# Patient Record
Sex: Female | Born: 1935 | Race: White | Hispanic: No | Smoking: Never smoker
Health system: Southern US, Community
[De-identification: ages and names within clinical notes are randomized; demographics above are authoritative.]

## PROBLEM LIST (undated history)

## (undated) DIAGNOSIS — M109 Gout, unspecified: Secondary | ICD-10-CM

## (undated) DIAGNOSIS — M1990 Unspecified osteoarthritis, unspecified site: Secondary | ICD-10-CM

## (undated) DIAGNOSIS — I441 Atrioventricular block, second degree: Secondary | ICD-10-CM

## (undated) DIAGNOSIS — F410 Panic disorder [episodic paroxysmal anxiety] without agoraphobia: Secondary | ICD-10-CM

## (undated) DIAGNOSIS — I5189 Other ill-defined heart diseases: Secondary | ICD-10-CM

## (undated) DIAGNOSIS — I272 Pulmonary hypertension, unspecified: Secondary | ICD-10-CM

## (undated) DIAGNOSIS — F0390 Unspecified dementia without behavioral disturbance: Secondary | ICD-10-CM

## (undated) DIAGNOSIS — I10 Essential (primary) hypertension: Secondary | ICD-10-CM

## (undated) DIAGNOSIS — Z9981 Dependence on supplemental oxygen: Secondary | ICD-10-CM

## (undated) DIAGNOSIS — E119 Type 2 diabetes mellitus without complications: Secondary | ICD-10-CM

## (undated) DIAGNOSIS — E782 Mixed hyperlipidemia: Secondary | ICD-10-CM

## (undated) HISTORY — PX: ABDOMINAL HYSTERECTOMY: SHX81

## (undated) HISTORY — PX: OVARIAN CYST REMOVAL: SHX89

## (undated) HISTORY — DX: Essential (primary) hypertension: I10

## (undated) HISTORY — DX: Pulmonary hypertension, unspecified: I27.20

## (undated) HISTORY — DX: Atrioventricular block, second degree: I44.1

## (undated) HISTORY — DX: Other ill-defined heart diseases: I51.89

## (undated) HISTORY — PX: BREAST BIOPSY: SHX20

## (undated) HISTORY — DX: Panic disorder (episodic paroxysmal anxiety): F41.0

## (undated) HISTORY — DX: Gout, unspecified: M10.9

## (undated) HISTORY — PX: HEMORRHOID SURGERY: SHX153

## (undated) HISTORY — DX: Unspecified osteoarthritis, unspecified site: M19.90

## (undated) HISTORY — DX: Type 2 diabetes mellitus without complications: E11.9

## (undated) HISTORY — DX: Mixed hyperlipidemia: E78.2

---

## 2007-04-15 ENCOUNTER — Ambulatory Visit: Payer: Self-pay | Admitting: Cardiology

## 2007-04-18 ENCOUNTER — Inpatient Hospital Stay (HOSPITAL_COMMUNITY): Admission: AD | Admit: 2007-04-18 | Discharge: 2007-04-20 | Payer: Self-pay | Admitting: Cardiology

## 2007-04-18 ENCOUNTER — Ambulatory Visit: Payer: Self-pay | Admitting: Cardiology

## 2007-04-19 HISTORY — PX: PACEMAKER INSERTION: SHX728

## 2007-05-03 ENCOUNTER — Ambulatory Visit: Payer: Self-pay | Admitting: Physician Assistant

## 2007-05-19 ENCOUNTER — Ambulatory Visit: Payer: Self-pay | Admitting: Internal Medicine

## 2007-07-14 ENCOUNTER — Ambulatory Visit: Payer: Self-pay | Admitting: Internal Medicine

## 2008-02-23 ENCOUNTER — Ambulatory Visit: Payer: Self-pay | Admitting: Internal Medicine

## 2008-09-20 ENCOUNTER — Ambulatory Visit: Payer: Self-pay | Admitting: Internal Medicine

## 2008-12-25 ENCOUNTER — Ambulatory Visit: Payer: Self-pay | Admitting: Cardiology

## 2009-01-02 ENCOUNTER — Ambulatory Visit: Payer: Self-pay | Admitting: Cardiology

## 2009-01-02 ENCOUNTER — Ambulatory Visit: Payer: Self-pay | Admitting: Internal Medicine

## 2009-01-02 ENCOUNTER — Inpatient Hospital Stay (HOSPITAL_COMMUNITY): Admission: AD | Admit: 2009-01-02 | Discharge: 2009-01-06 | Payer: Self-pay | Admitting: Cardiovascular Disease

## 2009-01-02 ENCOUNTER — Encounter: Payer: Self-pay | Admitting: Internal Medicine

## 2009-01-03 ENCOUNTER — Encounter: Payer: Self-pay | Admitting: Internal Medicine

## 2009-01-04 ENCOUNTER — Encounter: Payer: Self-pay | Admitting: Cardiology

## 2009-01-23 ENCOUNTER — Ambulatory Visit: Payer: Self-pay | Admitting: Cardiology

## 2009-01-30 ENCOUNTER — Encounter: Payer: Self-pay | Admitting: Physician Assistant

## 2009-02-05 ENCOUNTER — Encounter: Payer: Self-pay | Admitting: Cardiology

## 2009-02-11 ENCOUNTER — Encounter: Payer: Self-pay | Admitting: Internal Medicine

## 2009-02-20 ENCOUNTER — Ambulatory Visit: Payer: Self-pay | Admitting: Internal Medicine

## 2009-04-09 ENCOUNTER — Ambulatory Visit: Payer: Self-pay | Admitting: Cardiology

## 2009-04-28 ENCOUNTER — Encounter: Admission: RE | Admit: 2009-04-28 | Discharge: 2009-07-27 | Payer: Self-pay | Admitting: Cardiology

## 2009-05-01 ENCOUNTER — Encounter: Payer: Self-pay | Admitting: Cardiology

## 2009-05-05 ENCOUNTER — Encounter: Payer: Self-pay | Admitting: Cardiology

## 2009-05-20 ENCOUNTER — Encounter: Payer: Self-pay | Admitting: Cardiology

## 2009-08-12 ENCOUNTER — Telehealth: Payer: Self-pay | Admitting: Cardiology

## 2009-08-22 ENCOUNTER — Ambulatory Visit: Payer: Self-pay | Admitting: Internal Medicine

## 2009-08-25 ENCOUNTER — Encounter: Payer: Self-pay | Admitting: Internal Medicine

## 2009-09-05 ENCOUNTER — Encounter: Payer: Self-pay | Admitting: Cardiology

## 2009-12-08 ENCOUNTER — Ambulatory Visit: Payer: Self-pay | Admitting: Cardiology

## 2009-12-08 DIAGNOSIS — J984 Other disorders of lung: Secondary | ICD-10-CM

## 2009-12-08 DIAGNOSIS — I5032 Chronic diastolic (congestive) heart failure: Secondary | ICD-10-CM

## 2009-12-08 DIAGNOSIS — I453 Trifascicular block: Secondary | ICD-10-CM

## 2009-12-08 DIAGNOSIS — H8110 Benign paroxysmal vertigo, unspecified ear: Secondary | ICD-10-CM | POA: Insufficient documentation

## 2009-12-08 DIAGNOSIS — I10 Essential (primary) hypertension: Secondary | ICD-10-CM

## 2009-12-08 DIAGNOSIS — Z950 Presence of cardiac pacemaker: Secondary | ICD-10-CM

## 2010-01-01 ENCOUNTER — Ambulatory Visit: Payer: Self-pay | Admitting: Internal Medicine

## 2010-01-08 ENCOUNTER — Encounter: Payer: Self-pay | Admitting: Cardiology

## 2010-01-14 ENCOUNTER — Encounter (INDEPENDENT_AMBULATORY_CARE_PROVIDER_SITE_OTHER): Payer: Self-pay | Admitting: *Deleted

## 2010-03-20 ENCOUNTER — Ambulatory Visit: Payer: Self-pay | Admitting: Internal Medicine

## 2010-06-22 ENCOUNTER — Ambulatory Visit: Payer: Self-pay | Admitting: Cardiology

## 2010-06-22 DIAGNOSIS — R0602 Shortness of breath: Secondary | ICD-10-CM | POA: Insufficient documentation

## 2010-06-30 ENCOUNTER — Encounter: Payer: Self-pay | Admitting: Internal Medicine

## 2010-06-30 ENCOUNTER — Ambulatory Visit: Payer: Self-pay | Admitting: Internal Medicine

## 2010-06-30 ENCOUNTER — Encounter: Payer: Self-pay | Admitting: Physician Assistant

## 2010-07-01 ENCOUNTER — Encounter (INDEPENDENT_AMBULATORY_CARE_PROVIDER_SITE_OTHER): Payer: Self-pay | Admitting: *Deleted

## 2010-07-23 ENCOUNTER — Ambulatory Visit: Payer: Self-pay | Admitting: Cardiology

## 2010-08-02 ENCOUNTER — Encounter: Payer: Self-pay | Admitting: Cardiology

## 2010-08-02 ENCOUNTER — Encounter: Payer: Self-pay | Admitting: Physician Assistant

## 2010-08-03 ENCOUNTER — Ambulatory Visit: Payer: Self-pay | Admitting: Cardiology

## 2010-08-04 ENCOUNTER — Ambulatory Visit: Payer: Self-pay | Admitting: Internal Medicine

## 2010-08-07 ENCOUNTER — Ambulatory Visit: Payer: Self-pay | Admitting: Internal Medicine

## 2010-08-12 ENCOUNTER — Encounter: Payer: Self-pay | Admitting: Cardiology

## 2010-08-19 ENCOUNTER — Ambulatory Visit: Payer: Self-pay | Admitting: Physician Assistant

## 2010-08-19 DIAGNOSIS — E119 Type 2 diabetes mellitus without complications: Secondary | ICD-10-CM

## 2010-08-26 ENCOUNTER — Encounter (INDEPENDENT_AMBULATORY_CARE_PROVIDER_SITE_OTHER): Payer: Self-pay | Admitting: *Deleted

## 2010-09-01 ENCOUNTER — Ambulatory Visit: Payer: Self-pay | Admitting: Cardiology

## 2010-09-01 ENCOUNTER — Encounter: Payer: Self-pay | Admitting: Physician Assistant

## 2010-09-18 ENCOUNTER — Ambulatory Visit: Payer: Self-pay | Admitting: Physician Assistant

## 2010-09-21 ENCOUNTER — Encounter (INDEPENDENT_AMBULATORY_CARE_PROVIDER_SITE_OTHER): Payer: Self-pay | Admitting: *Deleted

## 2010-10-06 ENCOUNTER — Encounter: Payer: Self-pay | Admitting: Physician Assistant

## 2010-10-28 ENCOUNTER — Encounter: Payer: Self-pay | Admitting: Physician Assistant

## 2010-11-04 ENCOUNTER — Ambulatory Visit: Payer: Self-pay | Admitting: Internal Medicine

## 2011-01-20 ENCOUNTER — Ambulatory Visit: Admission: RE | Admit: 2011-01-20 | Discharge: 2011-01-20 | Payer: Self-pay | Source: Home / Self Care

## 2011-01-20 ENCOUNTER — Encounter: Payer: Self-pay | Admitting: Internal Medicine

## 2011-01-28 NOTE — Cardiovascular Report (Signed)
Summary: [facility]  [facility]/ [facility] SUMMARY   Imported By: Dorise Hiss 08/11/2010 16:00:38  _____________________________________________________________________  External Attachment:    Type:   Image     Comment:   External Document

## 2011-01-28 NOTE — Cardiovascular Report (Signed)
Summary: TTM   TTM   Imported By: Roderic Ovens 01/21/2010 10:44:35  _____________________________________________________________________  External Attachment:    Type:   Image     Comment:   External Document

## 2011-01-28 NOTE — Cardiovascular Report (Signed)
Summary: TTM   TTM   Imported By: Roderic Ovens 07/16/2010 16:11:34  _____________________________________________________________________  External Attachment:    Type:   Image     Comment:   External Document

## 2011-01-28 NOTE — Letter (Signed)
Summary: Engineer, materials at [facility]  [address]   [place], [place] [address]   Phone: [phone]  Fax: [phone]        January 14, 2010 MRN: [id number]   [name] [name] [address] [facility] [address], [place]  [address]   Dear Ms. Sunday,  Your test ordered by Selena Batten has been reviewed by your physician (or physician assistant) and was found to be normal or stable. Your physician (or physician assistant) felt no changes were needed at this time.  ____ Echocardiogram  ____ Cardiac Stress Test  ____ Lab Work  ____ Peripheral vascular study of arms, legs or neck  ____ CT scan or X-ray  ____ Lung or Breathing test  __X__ Other:  blood pressure readings are within goal   Thank you.   Hoover Brunette, LPN    Duane Boston, M.D., F.A.C.C. Thressa Sheller, M.D., F.A.C.C. Oneal Grout, M.D., F.A.C.C. Cheree Ditto, M.D., F.A.C.C. Daiva Nakayama, M.D., F.A.C.C. Kenney Houseman, M.D., F.A.C.C. Jeanne Ivan, PA-C

## 2011-01-28 NOTE — Cardiovascular Report (Signed)
Summary: TTM   TTM   Imported By: Roderic Ovens 08/18/2010 11:16:20  _____________________________________________________________________  External Attachment:    Type:   Image     Comment:   External Document

## 2011-01-28 NOTE — Cardiovascular Report (Signed)
Summary: [facility]/ [facility]  [facility]/ [facility] SUMMARY   Imported By: Dorise Hiss 03/23/2010 10:53:34  _____________________________________________________________________  External Attachment:    Type:   Image     Comment:   External Document

## 2011-01-28 NOTE — Assessment & Plan Note (Signed)
Summary: 6 MO FU PER JUNE REMINDER-SRS   Visit Type:  Follow-up Primary Provider:  Margo Common   History of Present Illness: the patient is an 77 year old female history of poor exercise tolerance. The patient due to prior 6 minute walk Desaturation and is on oxygen therapy. She is also followed by Dr. Orson Aloe. She is felt to have chronic diastolic heart failure. Prior cardiac catheterization showed no significant coronary arteries and normal LV function. She also had a heart catheterization done earlier this year which showed normal right-sided pressures. She currently denies any chest pain and she does report feeling weak and very short of breath on minimal exertion. She uses oxygen intermittently. In the office her oxygen saturation was 94% upon a small walk was 91%. It was quite clear that the patient was very deconditioned. The patient reports that she has developed some lower extremity edema. She denies however any chest pain palpitations or syncope. the  Preventive Screening-Counseling & Management  Alcohol-Tobacco     Smoking Status: never  Current Medications (verified): 1)  Lisinopril 20 Mg Tabs (Lisinopril) .Marland Kitchen.. 1 Tablet By Mouth Once A Day 2)  Verapamil Hcl Cr 120 Mg Xr24h-Cap (Verapamil Hcl) .... Take One Capsule By Mouth Twice Daily 3)  Metformin Hcl 500 Mg Tabs (Metformin Hcl) .... Take 1 Tablet By Mouth Two Times A Day 4)  Pravastatin Sodium 40 Mg Tabs (Pravastatin Sodium) .... Take One Tablet By Mouth Daily At Bedtime 5)  Calcium + D 600-200 Mg-Unit Tabs (Calcium Carbonate-Vitamin D) .... Once Daily 6)  Aspirin 81 Mg Tbec (Aspirin) .... Take One Tablet By Mouth Daily 7)  Potassium Chloride Cr 8 Meq Cr-Tabs (Potassium Chloride) .... Take 2 Tablet By Mouth Two Times A Day 8)  Multivitamins   Tabs (Multiple Vitamin) .... Once Daily 9)  Naprosyn 500 Mg Tabs (Naproxen) .... As Needed 10)  Glipizide 5 Mg Tabs (Glipizide) .... Take 2 Tablets Daily As Needed 11)  Furosemide 20 Mg  Tabs (Furosemide) .... Take One Tablet By Mouth Daily As Needed 12)  Hydrochlorothiazide 25 Mg Tabs (Hydrochlorothiazide) .... Take One Tablet By Mouth Daily. 13)  Advair Diskus 500-50 Mcg/dose Aepb (Fluticasone-Salmeterol) .... One Inhaltion Two Times A Day 14)  Acetaminophen 500 Mg Caps (Acetaminophen) .... As Needed 15)  Fish Oil 1000 Mg Caps (Omega-3 Fatty Acids) .... Take 1 Tablet By Mouth Three Times A Day  Allergies (verified): No Known Drug Allergies  Comments:  Nurse/Medical Assistant: The patient's medication bottles and allergies were reviewed with the patient and were updated in the Medication and Allergy Lists.  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 Jude pacemakerdual-chamber pacemaker Diastolic dysfunction Normal Cardiolite study in 2008with normal LV function Hypertension Diabetes mellitus Dyslipidemia Metabolic syndrome Chronic dyspnea felt to be secondary to diastolic dysfunction Obesity History of osteoarthritis and gout  Past Surgical History: Last updated: 12/24/2008 remote history of ovarian cyst removal Breast biopsy Hemorrhoidectomy Hysterectomy  Family History: Last updated: 08/18/2009 Family History of Cancer:  Family History of CVA or Stroke:  Heart CHF  Social History: Last updated: 08/18/2009 Retired  Widowed  Tobacco Use - No.  Alcohol Use - no Regular Exercise - no Drug Use - no  Risk Factors: Exercise: no (08/18/2009)  Risk Factors: Smoking Status: never (06/22/2010)  Review of Systems       The patient complains of fatigue and shortness of breath.  The patient denies malaise, fever, weight gain/loss, vision loss,  decreased hearing, hoarseness, chest pain, palpitations, prolonged cough, wheezing, sleep apnea, coughing up blood, abdominal pain, blood in stool, nausea, vomiting, diarrhea, heartburn, incontinence, blood  in urine, muscle weakness, joint pain, leg swelling, rash, skin lesions, headache, fainting, dizziness, depression, anxiety, enlarged lymph nodes, easy bruising or bleeding, and environmental allergies.    Vital Signs:  Patient profile:   77 year old female Height:      60 inches Weight:      207 pounds O2 Sat:      94 % on Room air Pulse rate:   65 / minute BP sitting:   140 / 70  (left arm) Cuff size:   large  Vitals Entered By: Carlye Grippe (June 22, 2010 1:59 PM)  O2 Flow:  Room air  Physical Exam  Additional Exam:  General: Well-developed, well-nourished in no distress head: Normocephalic and atraumatic eyes PERRLA/EOMI intact, conjunctiva and lids normal nose: No deformity or lesions mouth normal dentition, normal posterior pharynx neck: Supple, no JVD.  No masses, thyromegaly or abnormal cervical nodes lungs: Normal breath sounds bilaterally without wheezing.  Normal percussion heart: regular rate and rhythm with normal S1 and S2, no S3 or S4.  PMI is normal.  No pathological murmurs abdomen: Normal bowel sounds, abdomen is soft and nontender without masses, organomegaly or hernias noted.  No hepatosplenomegaly musculoskeletal: Back normal, normal gait muscle strength and tone normal pulsus: Pulse is normal in all 4 extremities Extremities: 1+ peripheral pitting edema neurologic: Alert and oriented x 3 skin: Intact without lesions or rashes cervical nodes: No significant adenopathy psychologic: Normal affect    PPM Specifications Following MD:  Sherryl Manges, MD     PPM Vendor:  St Jude     PPM Model Number:  [id number]     PPM Serial Number:  [id number] PPM DOI:  04/19/2007     PPM Implanting MD:  Sherryl Manges, MD  Lead 1    Location: RA     DOI: 04/19/2007     Model #: [id number]     Serial #: [id number]     Status: active Lead 2    Location: RV     DOI: 04/19/2007     Model #: [id number]     Serial #: [id number]     Status: active  Magnet Response Rate:  BOL 98.6 ERI   86.3  Indications:  Syncope; tri-fasicular block   PPM Follow Up Pacer Dependent:  No      Episodes Coumadin:  No  Parameters Mode:  DDD     Lower Rate Limit:  50     Upper Rate Limit:  105 Paced AV Delay:  300     Sensed AV Delay:  275  Impression & Recommendations:  Problem # 1:  SHORTNESS OF BREATH (ICD-786.05) the patient has chronic shortness of breath. Is followed by Dr. Orson Aloe. Her oxygen saturation was stable. There is a small degree of volume overload with a large part patient is significantly deconditioned. Her updated medication list for this problem includes:    Lisinopril 20 Mg Tabs (Lisinopril) .Marland Kitchen... 1 tablet by mouth once a day    Verapamil Hcl Cr 120 Mg Xr24h-cap (Verapamil hcl) .Marland Kitchen... Take one capsule by mouth twice daily    Aspirin 81 Mg Tbec (Aspirin) .Marland Kitchen... Take one tablet by mouth daily    Furosemide 20 Mg Tabs (Furosemide) .Marland Kitchen... Take one tablet by mouth daily as needed    Hydrochlorothiazide 25 Mg Tabs (Hydrochlorothiazide) .Marland Kitchen... Take one tablet by mouth  daily.  Future Orders: T-Basic Metabolic Panel [id number]) ... 06/30/2010 T-BNP  (B Natriuretic Peptide) [id number]) ... 06/30/2010  Problem # 2:  CHRONIC DIASTOLIC HEART FAILURE (ICD-428.32) the patient is some increased lower showed edema. I asked her to take Lasix 20 mg p.o. q. daily x5 days only. We will check blood work in one week. Her updated medication list for this problem includes:    Lisinopril 20 Mg Tabs (Lisinopril) .Marland Kitchen... 1 tablet by mouth once a day    Verapamil Hcl Cr 120 Mg Xr24h-cap (Verapamil hcl) .Marland Kitchen... Take one capsule by mouth twice daily    Aspirin 81 Mg Tbec (Aspirin) .Marland Kitchen... Take one tablet by mouth daily    Furosemide 20 Mg Tabs (Furosemide) .Marland Kitchen... Take one tablet by mouth daily as needed    Hydrochlorothiazide 25 Mg Tabs (Hydrochlorothiazide) .Marland Kitchen... Take one tablet by mouth daily.  Problem # 3:  CARDIAC PACEMAKER IN SITU (ICD-V45.01) the patient's father Dr. Johney Frame. She apparently  has normal pacemaker function.  Problem # 4:  ESSENTIAL HYPERTENSION, BENIGN (ICD-401.1) blood pressure is well controlled. I made no further adjustments in her medications. Her updated medication list for this problem includes:    Lisinopril 20 Mg Tabs (Lisinopril) .Marland Kitchen... 1 tablet by mouth once a day    Verapamil Hcl Cr 120 Mg Xr24h-cap (Verapamil hcl) .Marland Kitchen... Take one capsule by mouth twice daily    Aspirin 81 Mg Tbec (Aspirin) .Marland Kitchen... Take one tablet by mouth daily    Furosemide 20 Mg Tabs (Furosemide) .Marland Kitchen... Take one tablet by mouth daily as needed    Hydrochlorothiazide 25 Mg Tabs (Hydrochlorothiazide) .Marland Kitchen... Take one tablet by mouth daily.  Future Orders: T-Basic Metabolic Panel [id number]) ... 06/30/2010 T-BNP  (B Natriuretic Peptide) [id number]) ... 06/30/2010  Patient Instructions: 1)  Lasix 20mg  daily x 5 days, then resume previous dose 2)  Labs:  BMET, BNP - due in 5 days 3)  Follow up in  1 month

## 2011-01-28 NOTE — Consult Note (Signed)
Summary: CARDIOLOGY CONSULT/ [facility]  CARDIOLOGY CONSULT/ [facility]   Imported By: Zachary George 08/05/2010 09:40:56  _____________________________________________________________________  External Attachment:    Type:   Image     Comment:   External Document

## 2011-01-28 NOTE — Medication Information (Signed)
Summary: RX Folder/ 4 Administrator, sports WITH Forensic scientist Folder/ 4 WHEEL WALKER WITH BRAKE   Imported By: Dorise Hiss 08/12/2010 16:26:30  _____________________________________________________________________  External Attachment:    Type:   Image     Comment:   External Document

## 2011-01-28 NOTE — Assessment & Plan Note (Signed)
Summary: PER PT CALL-IN [facility] NEEDS DEVICE CHECK-JM   Visit Type:  Pacemaker check Primary Provider:  Tapper   History of Present Illness: The patient presents today for routine electrophysiology followup.  She was recently hospitalized for SOB.  She is chronically on home oxygen but has not been using this when outside of the house.  She reports SOB and fatigue with moderate activity.  She also reports edema. The patient denies symptoms of palpitations, chest pain,  dizziness, presyncope, syncope, or neurologic sequela. The patient is tolerating medications without difficulties and is otherwise without complaint today.   Preventive Screening-Counseling & Management  Alcohol-Tobacco     Smoking Status: never  Current Medications (verified): 1)  Lisinopril 20 Mg Tabs (Lisinopril) .Marland Kitchen.. 1 Tablet By Mouth Once A Day 2)  Verapamil Hcl Cr 120 Mg Xr24h-Cap (Verapamil Hcl) .... Take One Capsule By Mouth Twice Daily 3)  Metformin Hcl 500 Mg Tabs (Metformin Hcl) .... Take 1 Tablet By Mouth Two Times A Day 4)  Pravastatin Sodium 40 Mg Tabs (Pravastatin Sodium) .... Take One Tablet By Mouth Daily At Bedtime 5)  Calcium + D 600-200 Mg-Unit Tabs (Calcium Carbonate-Vitamin D) .... Once Daily 6)  Aspirin 81 Mg Tbec (Aspirin) .... Take One Tablet By Mouth Daily 7)  Potassium Chloride Cr 8 Meq Cr-Tabs (Potassium Chloride) .... Take 2 Tablet By Mouth Two Times A Day 8)  Multivitamins   Tabs (Multiple Vitamin) .... Once Daily 9)  Naprosyn 500 Mg Tabs (Naproxen) .... As Needed 10)  Glipizide 5 Mg Tabs (Glipizide) .... Take 2 Tablets Daily As Needed 11)  Furosemide 20 Mg Tabs (Furosemide) .... Take One Tablet By Mouth Daily As Needed 12)  Hydrochlorothiazide 25 Mg Tabs (Hydrochlorothiazide) .... Take One Tablet By Mouth Daily. 13)  Advair Diskus 500-50 Mcg/dose Aepb (Fluticasone-Salmeterol) .... One Inhaltion Two Times A Day 14)  Acetaminophen 500 Mg Caps (Acetaminophen) .... As Needed 15)  Fish Oil 1000 Mg  Caps (Omega-3 Fatty Acids) .... Take 1 Tablet By Mouth Three Times A Day  Allergies (verified): No Known Drug Allergies  Comments:  Nurse/Medical Assistant: The patient is currently on medications but does not know the name or dosage at this time. Instructed to contact our office with details. Will update medication list at that time.  Past History:  Past Medical History: Reviewed history from 12/24/2008 and no changes required. 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 Jude pacemakerdual-chamber pacemaker Diastolic dysfunction Normal Cardiolite study in 2008with normal LV function Hypertension Diabetes mellitus Dyslipidemia Metabolic syndrome Chronic dyspnea felt to be secondary to diastolic dysfunction Obesity History of osteoarthritis and gout  Past Surgical History: Reviewed history from 12/24/2008 and no changes required. remote history of ovarian cyst removal Breast biopsy Hemorrhoidectomy Hysterectomy  Social History: Reviewed history from 08/18/2009 and no changes required. Retired  Widowed  Tobacco Use - No.  Alcohol Use - no Regular Exercise - no Drug Use - no  Review of Systems       All systems are reviewed and negative except as listed in the HPI.   Vital Signs:  Patient profile:   77 year old female Height:      60 inches Weight:      211 pounds Pulse rate:   58 / minute BP sitting:   141 / 76  (left arm) Cuff size:   large  Vitals Entered By: Carlye Grippe (August 07, 2010 9:03 AM)  Physical Exam  General:  obese, NAD Head:  normocephalic  and atraumatic Eyes:  PERRLA/EOM intact; conjunctiva and lids normal. Mouth:  Teeth, gums and palate normal. Oral mucosa normal. Neck:  JVP 10cm Chest Wall:  pacemaker site well healed Lungs:  few basilar rales, mostly clear, normal WOB Heart:  RRR, no m/r/g Abdomen:  Bowel sounds positive; abdomen soft and non-tender without masses, organomegaly, or hernias  noted. No hepatosplenomegaly. Msk:  Back normal, normal gait. Muscle strength and tone normal. Pulses:  pulses normal in all 4 extremities Extremities:  1+ BLE Edema Neurologic:  Alert and oriented x 3.   PPM Specifications Following MD:  Sherryl Manges, MD     PPM Vendor:  St Jude     PPM Model Number:  [id number]     PPM Serial Number:  [id number] PPM DOI:  04/19/2007     PPM Implanting MD:  Sherryl Manges, MD  Lead 1    Location: [facility]     DOI: 04/19/2007     Model #: [id number]     Serial #: [id number]     Status: active Lead 2    Location: RV     DOI: 04/19/2007     Model #: [id number]     Serial #: [id number]     Status: active  Magnet Response Rate:  BOL 98.6 ERI  86.3  Indications:  Syncope; tri-fasicular block   PPM Follow Up Battery Voltage:  2.78 V     Battery Est. Longevity:  7.75-10 yrs     Pacer Dependent:  No       PPM Device Measurements Atrium  Amplitude: 1.7 mV, Impedance: 329 ohms, Threshold: 0.750 V at 0.4 msec Right Ventricle  Amplitude: 4.7 mV, Impedance: 333 ohms, Threshold: 0.625 V at 0.4 msec  Episodes MS Episodes:  0     Coumadin:  No Ventricular High Rate:  0     Atrial Pacing:  2.5%     Ventricular Pacing:  33%  Parameters Mode:  DDD     Lower Rate Limit:  50     Upper Rate Limit:  105 Paced AV Delay:  300     Sensed AV Delay:  275 Next Cardiology Appt Due:  01/27/2011 Tech Comments:  PT WAS IN HOSP LAST FRIDAY FOR CHEST TIGHTNESS AND SOB.  NORMAL DEVICE FUNCTION.  CHANGED RA OUTPUT TO 1.75 AND RV OUTPUT TO 0.875 V.  TURNED RATE RESPONSE ON DURING MODE SWITCH.  [employer] PHONE CHECK IN 3 MTHS AND ROV IN 6 MTHS W/CLINIC.  Vella Kohler  August 07, 2010 9:23 AM MD Comments:  agree  Impression & Recommendations:  Problem # 1:  CHRONIC DIASTOLIC HEART FAILURE (ICD-428.32) SHe appears to have mild acute on chronic CHF. I have recommended salt restriction and daily weights. She will take lasix 20mg  daily for the next 3 days, then return to HCTZ alone. SHe may need daily lasix  longterm.  SHe is encouraged to wear O2 when ambulatory.  Problem # 2:  SHORTNESS OF BREATH (ICD-786.05) as above no wheezing today  Problem # 3:  CARDIAC PACEMAKER IN SITU (ICD-V45.01) normal pacemaker function for trifascicular block  Problem # 4:  ESSENTIAL HYPERTENSION, BENIGN (ICD-401.1)  stable  Her updated medication list for this problem includes:    Lisinopril 20 Mg Tabs (Lisinopril) .Marland Kitchen... 1 tablet by mouth once a day    Verapamil Hcl Cr 120 Mg Xr24h-cap (Verapamil hcl) .Marland Kitchen... Take one capsule by mouth twice daily    Aspirin 81 Mg Tbec (Aspirin) .Marland Kitchen... Take one tablet by mouth daily  Furosemide 20 Mg Tabs (Furosemide) .Marland Kitchen... Take one tablet by mouth daily as needed    Hydrochlorothiazide 25 Mg Tabs (Hydrochlorothiazide) .Marland Kitchen... Take one tablet by mouth daily.  Patient Instructions: 1)  Take Lasix (furosemide) 20mg  once daily for 3 days.  2)  Salt Restriction 3)  Keep appt with Rozell Searing, PA on August 18, 2010 at 2:20pm.

## 2011-01-28 NOTE — Letter (Signed)
Summary: Engineer, materials at [facility]  [address]   [place], [place] [address]   Phone: [phone]  Fax: [phone]        August 26, 2010 MRN: [id number]   EVANNE MATSUNAGA [address] [facility] [address], [place]  [address]   Dear Ms. Pautsch,  Your test ordered by Selena Batten has been reviewed by your physician (or physician assistant) and was found to be normal or stable. Your physician (or physician assistant) felt no changes were needed at this time.  ____ Echocardiogram  ____ Cardiac Stress Test  __X__ Lab Work  ____ Peripheral vascular study of arms, legs or neck  ____ CT scan or X-ray  ____ Lung or Breathing test  ____ Other:   Thank you.   Hoover Brunette, LPN    Duane Boston, M.D., F.A.C.C. Thressa Sheller, M.D., F.A.C.C. Oneal Grout, M.D., F.A.C.C. Cheree Ditto, M.D., F.A.C.C. Daiva Nakayama, M.D., F.A.C.C. Kenney Houseman, M.D., F.A.C.C. Jeanne Ivan, PA-C

## 2011-01-28 NOTE — Assessment & Plan Note (Signed)
Summary: 1 MO F/U PER 8/24 OV-JM   Visit Type:  Follow-up Primary Provider:  Margo Common   History of Present Illness: patient presents for scheduled early followup.  When last seen, I referred her for followup labs, after being placed on Lasix 20 mg daily, per Dr. Johney Frame, for treatment of probable diastolic heart failure. Renal function was normal, with potassium 4.0.  I also ordered a followup 2-D echo, for reassessment of LVF. This indicated normal LVF, with evidence of diastolic dysfunction, mild MR, and moderate pulmonary hypertension.  She continues to complain of exertional dyspnea. She denies any associated chest pain, or tachycardia palpitations. She has noticed marked decrease in her peripheral edema.  Preventive Screening-Counseling & Management  Alcohol-Tobacco     Smoking Status: never  Current Medications (verified): 1)  Lisinopril 20 Mg Tabs (Lisinopril) .Marland Kitchen.. 1 Tablet By Mouth Once A Day 2)  Verapamil Hcl Cr 120 Mg Xr24h-Cap (Verapamil Hcl) .... Take One Capsule By Mouth Twice Daily 3)  Metformin Hcl 500 Mg Tabs (Metformin Hcl) .... Take 1 Tablet By Mouth Two Times A Day 4)  Pravastatin Sodium 40 Mg Tabs (Pravastatin Sodium) .... Take One Tablet By Mouth Daily At Bedtime 5)  Calcium + D 600-200 Mg-Unit Tabs (Calcium Carbonate-Vitamin D) .... Once Daily 6)  Aspirin 81 Mg Tbec (Aspirin) .... Take One Tablet By Mouth Daily 7)  Potassium Chloride Cr 8 Meq Cr-Tabs (Potassium Chloride) .... Take 2 Tablet By Mouth Two Times A Day 8)  Multivitamins   Tabs (Multiple Vitamin) .... Once Daily 9)  Naprosyn 500 Mg Tabs (Naproxen) .... As Needed 10)  Glipizide 5 Mg Tabs (Glipizide) .... Take 2 Tablets Daily As Needed 11)  Furosemide 20 Mg Tabs (Furosemide) .... Take One Tablet By Mouth Daily As Needed 12)  Hydrochlorothiazide 25 Mg Tabs (Hydrochlorothiazide) .... Take One Tablet By Mouth Daily. 13)  Advair Diskus 500-50 Mcg/dose Aepb (Fluticasone-Salmeterol) .... One Inhaltion Two Times  A Day(Not Taking) 14)  Acetaminophen 500 Mg Caps (Acetaminophen) .... As Needed 15)  Fish Oil 1000 Mg Caps (Omega-3 Fatty Acids) .... Take 1 Tablet By Mouth Three Times A Day  Allergies (verified): No Known Drug Allergies  Comments:  Nurse/Medical Assistant: The patient's medication list and allergies were reviewed with the patient and were updated in the Medication and Allergy Lists.  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 Jude pacemakerdual-chamber pacemaker Diastolic dysfunction Normal Cardiolite study in 2008with normal LV function Hypertension Diabetes mellitus Dyslipidemia Metabolic syndrome Chronic dyspnea felt to be secondary to diastolic dysfunction Obesity History of osteoarthritis Gout Mitral regurgitation... mild by 2-D echo, 9/11 Pulmonary hypertension, moderate  Review of Systems       No fevers, chills, hemoptysis, dysphagia, melena, hematocheezia, hematuria, rash, claudication, orthopnea, or pnd. complaint of urinary incontinence. All other systems negative.    Vital Signs:  Patient profile:   77 year old female Height:      60 inches Weight:      203 pounds BMI:     39.79 Pulse rate:   71 / minute BP sitting:   112 / 74  (left arm) Cuff size:   large  Vitals Entered By: Carlye Grippe (September 18, 2010 1:37 PM)  Nutrition Counseling: Patient's BMI is greater than 25 and therefore counseled on weight management options.  Physical Exam  Additional Exam:  GEN: 77 year old female, morbidly obese, no distress HEENT: NCAT,PERRLA,EOMI NECK: palpable pulses, no bruits; no JVD; no TM LUNGS:  CTA bilaterally HEART: RRR (S1S2); no significant murmurs; no rubs; no gallops ABD: soft, NT; intact BS EXT: intact distal pulses; no edema SKIN: warm, dry MUSC: no obvious deformity NEURO: A/O (x3)     PPM Specifications Following MD:  Sherryl Manges, MD     PPM Vendor:  [employer]  [employer]     PPM Model Number:  [phone]     PPM Serial Number:  [id number] DOI:  04/19/2007     PPM Implanting MD:  Sherryl Manges, MD  Lead 1    Location: RA     DOI: 04/19/2007     Model #: [id number]     Serial #: [id number]     Status: active Lead 2    Location: RV     DOI: 04/19/2007     Model #: [id number]     Serial #: [id number]     Status: active  Magnet Response Rate:  BOL 98.6 ERI  86.3  Indications:  Syncope; tri-fasicular block   PPM Follow Up Pacer Dependent:  No      Episodes Coumadin:  No  Parameters Mode:  DDD     Lower Rate Limit:  50     Upper Rate Limit:  105 Paced AV Delay:  300     Sensed AV Delay:  275  Impression & Recommendations:  Problem # 1:  CHRONIC DIASTOLIC HEART FAILURE (ICD-428.32)  patient has lost 1 pound since her last visit, and 8 pounds since being placed on maintenance Lasix 20 mg daily. Will reassess electrolytes/renal function with surveillance labs today. We'll also check baseline BNP level. Will also schedule an appointment with Dr. Beatris Ship, for evaluation of possible bladder prolapse. Given her frequent trips to the bathroom, I elected to not up titrate her Lasix dose, at present time, and also pending a formal urology evaluation. We'll reassess her diuretic regimen, at time of her next visit with [place].  Problem # 2:  CARDIAC PACEMAKER IN SITU (ICD-V45.01)  follow up Dr. Hillis Range, as scheduled.  Problem # 3:  DM (ICD-250.00) Assessment: Comment Only  Her updated medication list for this problem includes:    Lisinopril 20 Mg Tabs (Lisinopril) .Marland Kitchen... 1 tablet by mouth once a day    Metformin Hcl 500 Mg Tabs (Metformin hcl) .Marland Kitchen... Take 1 tablet by mouth two times a day    Aspirin 81 Mg Tbec (Aspirin) .Marland Kitchen... Take one tablet by mouth daily    Glipizide 5 Mg Tabs (Glipizide) .Marland Kitchen... Take 2 tablets daily as needed  Problem # 4:  ESSENTIAL HYPERTENSION, BENIGN (ICD-401.1)  well controlled on current medication regimen.  Her updated medication list for this  problem includes:    Lisinopril 20 Mg Tabs (Lisinopril) .Marland Kitchen... 1 tablet by mouth once a day    Verapamil Hcl Cr 120 Mg Xr24h-cap (Verapamil hcl) .Marland Kitchen... Take one capsule by mouth twice daily    Aspirin 81 Mg Tbec (Aspirin) .Marland Kitchen... Take one tablet by mouth daily    Furosemide 20 Mg Tabs (Furosemide) .Marland Kitchen... Take one tablet by mouth daily as needed    Hydrochlorothiazide 25 Mg Tabs (Hydrochlorothiazide) .Marland Kitchen... Take one tablet by mouth daily.  Other Orders: T-Basic Metabolic Panel [phone]) T-BNP  (B Natriuretic Peptide) [phone]) Urology Referral (Urology)  Patient Instructions: 1)  You have been referred to Dr. Beatris Ship, Urologist. 2)  Your physician recommends that you go to the [facility] for lab work. 3)  Your physician wants you to follow-up in: 3 months. You will receive  a reminder letter in the mail one-two months in advance. If you don't receive a letter, please call our office to schedule the follow-up appointment.

## 2011-01-28 NOTE — Letter (Signed)
Summary: External Correspondence/ [facility]  External Correspondence/ [facility]   Imported By: Dorise Hiss 10/06/2010 15:17:31  _____________________________________________________________________  External Attachment:    Type:   Image     Comment:   External Document

## 2011-01-28 NOTE — Progress Notes (Signed)
Summary: Office Visit/ BLOOD PRESSURE READINGS  Office Visit/ BLOOD PRESSURE READINGS   Imported By: Dorise Hiss 01/08/2010 14:47:31  _____________________________________________________________________  External Attachment:    Type:   Image     Comment:   External Document  Appended Document: Office Visit/ BLOOD PRESSURE READINGS BP readings are within goal  Appended Document: Office Visit/ BLOOD PRESSURE READINGS Patient notified.

## 2011-01-28 NOTE — Letter (Signed)
Summary: Engineer, materials at [facility]  [address]   [place], [place] [address]   Phone: [phone]  Fax: [phone]        July 01, 2010 MRN: [id number]   JANSEN GOODPASTURE [address] [facility] [address], [place]  [address]   Dear Ms. Lockridge,  Your test ordered by Selena Batten has been reviewed by your physician (or physician assistant) and was found to be normal or stable. Your physician (or physician assistant) felt no changes were needed at this time.  ____ Echocardiogram  ____ Cardiac Stress Test  __X__ Lab Work  ____ Peripheral vascular study of arms, legs or neck  ____ CT scan or X-ray  ____ Lung or Breathing test  ____ Other:   Thank you.   Hoover Brunette, LPN    Duane Boston, M.D., F.A.C.C. Thressa Sheller, M.D., F.A.C.C. Oneal Grout, M.D., F.A.C.C. Cheree Ditto, M.D., F.A.C.C. Daiva Nakayama, M.D., F.A.C.C. Kenney Houseman, M.D., F.A.C.C. Jeanne Ivan, PA-C

## 2011-01-28 NOTE — Assessment & Plan Note (Signed)
Summary: eph -d/c [facility]   Visit Type:  Follow-up Primary [name] [name]:  [name] [name]   History of Present Illness: patient presents for post [facility] followup, following admission with dyspnea.   Patient was ultimately diagnosed with anxiety disorder. There was no evidence of ischemia or congestive heart failure (BNP level 26). Given history of VT during dobutamine stress testing in 2010, however, (subsequent normal cardiac catheterization), arrangements were made for pacemaker interrogation here in our device clinic, per Dr. Johney Frame. This yielded normal device function, and adjustments were made in RA and RV output, with rate response turned on during mode switch.  Of note, Dr. Johney Frame also recommended taking Lasix 20 mg on a daily, rather than p.r.n., basis, for treatment of mild acute/chronic diastolic heart failure. This has resulted in a loss of 7 pounds. However, patient continues to report DOE, with no associated chest pain. She has noted marked improvement in her peripheral edema.  Preventive Screening-Counseling & Management  Alcohol-Tobacco     Smoking Status: never  Current Medications (verified): 1)  Lisinopril 20 Mg Tabs (Lisinopril) .Marland Kitchen.. 1 Tablet By Mouth Once A Day 2)  Verapamil Hcl Cr 120 Mg Xr24h-Cap (Verapamil Hcl) .... Take One Capsule By Mouth Twice Daily 3)  Metformin Hcl 500 Mg Tabs (Metformin Hcl) .... Take 1 Tablet By Mouth Two Times A Day 4)  Pravastatin Sodium 40 Mg Tabs (Pravastatin Sodium) .... Take One Tablet By Mouth Daily At Bedtime 5)  Calcium + D 600-200 Mg-Unit Tabs (Calcium Carbonate-Vitamin D) .... Once Daily 6)  Aspirin 81 Mg Tbec (Aspirin) .... Take One Tablet By Mouth Daily 7)  Potassium Chloride Cr 8 Meq Cr-Tabs (Potassium Chloride) .... Take 2 Tablet By Mouth Two Times A Day 8)  Multivitamins   Tabs (Multiple Vitamin) .... Once Daily 9)  Naprosyn 500 Mg Tabs (Naproxen) .... As Needed 10)  Glipizide 5 Mg Tabs (Glipizide) .... Take 2 Tablets Daily As  Needed 11)  Furosemide 20 Mg Tabs (Furosemide) .... Take One Tablet By Mouth Daily As Needed 12)  Hydrochlorothiazide 25 Mg Tabs (Hydrochlorothiazide) .... Take One Tablet By Mouth Daily. 13)  Advair Diskus 500-50 Mcg/dose Aepb (Fluticasone-Salmeterol) .... One Inhaltion Two Times A Day 14)  Acetaminophen 500 Mg Caps (Acetaminophen) .... As Needed 15)  Fish Oil 1000 Mg Caps (Omega-3 Fatty Acids) .... Take 1 Tablet By Mouth Three Times A Day  Allergies (verified): No Known Drug Allergies  Comments:  Nurse/Medical Assistant: The patient's medication list and allergies were reviewed with the patient and were updated in the Medication and Allergy Lists.  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 Jude pacemakerdual-chamber pacemaker Diastolic dysfunction Normal Cardiolite study in 2008with normal LV function Hypertension Diabetes mellitus Dyslipidemia Metabolic syndrome Chronic dyspnea felt to be secondary to diastolic dysfunction Obesity History of osteoarthritis and gout  Review of Systems       No fevers, chills, hemoptysis, dysphagia, melena, hematocheezia, hematuria, rash, claudication, orthopnea, pnd, pedal edema. All other systems negative.   Vital Signs:  Patient profile:   77 year old female Height:      60 inches Weight:      204 pounds Pulse rate:   67 / minute BP sitting:   117 / 70  (left arm) Cuff size:   large  Vitals Entered By: [name] [name] (August 19, 2010 2:50 PM)  Physical Exam  Additional Exam:  GEN: 77 year old female, morbidly obese, no distress HEENT: NCAT,PERRLA,EOMI NECK:  palpable pulses, no bruits; no JVD; no TM LUNGS: CTA bilaterally HEART: RRR (S1S2); no significant murmurs; no rubs; no gallops ABD: soft, NT; intact BS EXT: intact distal pulses; no edema SKIN: warm, dry MUSC: no obvious deformity NEURO: A/O (x3)     PPM  Specifications Following MD:  Sherryl Manges, MD     PPM Vendor:  [employer] Jude     PPM Model Number:  [id number]     PPM Serial Number:  [id number] DOI:  04/19/2007     PPM Implanting MD:  Sherryl Manges, MD  Lead 1    Location: RA     DOI: 04/19/2007     Model #: [id number]     Serial #: [id number]     Status: active Lead 2    Location: RV     DOI: 04/19/2007     Model #: 1788TC     Serial #: [id number]     Status: active  Magnet Response Rate:  BOL 98.6 ERI  86.3  Indications:  Syncope; tri-fasicular block   PPM Follow Up Pacer Dependent:  No      Episodes Coumadin:  No  Parameters Mode:  DDD     Lower Rate Limit:  50     Upper Rate Limit:  105 Paced AV Delay:  300     Sensed AV Delay:  275  Impression & Recommendations:  Problem # 1:  CHRONIC DIASTOLIC HEART FAILURE (ICD-428.32)  continue current treatment regimen with Lasix 20 mg daily. Check followup metabolic profile, for monitoring of electrolytes and renal function. Patient advised to not add any salt to her diet. We'll reassess LV function with a 2-D echo, and schedule return visit with me and Dr. Andee Lineman in one month.  Problem # 2:  CARDIAC PACEMAKER IN SITU (ICD-V45.01)  patient has history of syncope/trifascicular block, with recent interrogation yielding normal device function. Adjustments were made, as noted above. Followup with Dr. Johney Frame, as previously scheduled.  Problem # 3:  ESSENTIAL HYPERTENSION, BENIGN (ICD-401.1) Assessment: Comment Only  Her updated medication list for this problem includes:    Lisinopril 20 Mg Tabs (Lisinopril) .Marland Kitchen... 1 tablet by mouth once a day    Verapamil Hcl Cr 120 Mg Xr24h-cap (Verapamil hcl) .Marland Kitchen... Take one capsule by mouth twice daily    Aspirin 81 Mg Tbec (Aspirin) .Marland Kitchen... Take one tablet by mouth daily    Furosemide 20 Mg Tabs (Furosemide) .Marland Kitchen... Take one tablet by mouth daily as needed    Hydrochlorothiazide 25 Mg Tabs (Hydrochlorothiazide) .Marland Kitchen... Take one tablet by mouth daily.  Problem # 4:  DM  (ICD-250.00) Assessment: Comment Only  normal cardiac catheterization, 1/10, following documented MMVT during dobutamine stress testing.  Other Orders: 2-D Echocardiogram (2D Echo) T-Basic Metabolic Panel ([id number])  Patient Instructions: 1)  Your physician recommends that you go to the [facility] for lab work: DO TODAY. 2)  Your physician has requested that you have an echocardiogram.  Echocardiography is a painless test that uses sound waves to create images of your heart. It provides your doctor with information about the size and shape of your heart and how well your heart's chambers and valves are working.  This procedure takes approximately one hour. There are no restrictions for this procedure. 3)  Continue Lasix 20mg  by mouth once daily. 4)  No added-salt diet. 5)  Follow up with Gene Serpe, PA-C/Dr. Earnestine Leys on Friday, Sept 23, 2011 at 1:40pm.

## 2011-01-28 NOTE — Letter (Signed)
Summary: Engineer, materials at [facility]  [address]   [place], [place] [address]   Phone: [phone]  Fax: [phone]        September 21, 2010 MRN: [id number]   [name] [name] [address] [facility] [address] [place]  [address]   Dear [name],  Your test ordered by Selena Batten has been reviewed by your physician (or physician assistant) and was found to be normal or stable. Your physician (or physician assistant) felt no changes were needed at this time.  ____ Echocardiogram  ____ Cardiac Stress Test  __X__ Lab Work  ____ Peripheral vascular study of arms, legs or neck  ____ CT scan or X-ray  ____ Lung or Breathing test  ____ Other:   Thank you.   Hoover Brunette, LPN    Duane Boston, M.D., F.A.C.C. Thressa Sheller, M.D., F.A.C.C. Oneal Grout, M.D., F.A.C.C. Cheree Ditto, M.D., F.A.C.C. Daiva Nakayama, M.D., F.A.C.C. Kenney Houseman, M.D., F.A.C.C. Jeanne Ivan, PA-C

## 2011-01-28 NOTE — Assessment & Plan Note (Signed)
Summary: 1 MO FU   Visit Type:  Follow-up Primary Provider:  Margo Common   History of Present Illness: [name] [name] is a 77 year old female with poor exercise tolerance. However she has no significant coronary artery disease. Her catheterization was essentially within normal limits, showed normal LV function and normal right heart pressures. [name] [name]'s shortness of breath is due to deconditioning. He also during laboratory work and again it was confirmed that she has no heart failure with a BNP level is quite low.  [name] [name] states that she has difficulty walking with her cane. She fatigues easily.she denies any chest pain orthopnea PND.[name] [name] also has stress incontinence which limits her ability to come out of [name] house. Asked her to see a urologist for this problem  Preventive Screening-Counseling & Management  Alcohol-Tobacco     Smoking Status: never  Current Medications (verified): 1)  Lisinopril 20 Mg Tabs (Lisinopril) .Marland Kitchen.. 1 Tablet By Mouth Once A Day 2)  Verapamil Hcl Cr 120 Mg Xr24h-Cap (Verapamil Hcl) .... Take One Capsule By Mouth Twice Daily 3)  Metformin Hcl 500 Mg Tabs (Metformin Hcl) .... Take 1 Tablet By Mouth Two Times A Day 4)  Pravastatin Sodium 40 Mg Tabs (Pravastatin Sodium) .... Take One Tablet By Mouth Daily At Bedtime 5)  Calcium + D 600-200 Mg-Unit Tabs (Calcium Carbonate-Vitamin D) .... Once Daily 6)  Aspirin 81 Mg Tbec (Aspirin) .... Take One Tablet By Mouth Daily 7)  Potassium Chloride Cr 8 Meq Cr-Tabs (Potassium Chloride) .... Take 2 Tablet By Mouth Two Times A Day 8)  Multivitamins   Tabs (Multiple Vitamin) .... Once Daily 9)  Naprosyn 500 Mg Tabs (Naproxen) .... As Needed 10)  Glipizide 5 Mg Tabs (Glipizide) .... Take 2 Tablets Daily As Needed 11)  Furosemide 20 Mg Tabs (Furosemide) .... Take One Tablet By Mouth Daily As Needed 12)  Hydrochlorothiazide 25 Mg Tabs (Hydrochlorothiazide) .... Take One Tablet By Mouth Daily. 13)  Advair Diskus 500-50  Mcg/dose Aepb (Fluticasone-Salmeterol) .... One Inhaltion Two Times A Day 14)  Acetaminophen 500 Mg Caps (Acetaminophen) .... As Needed 15)  Fish Oil 1000 Mg Caps (Omega-3 Fatty Acids) .... Take 1 Tablet By Mouth Three Times A Day  Allergies (verified): No Known Drug Allergies  Comments:  Nurse/Medical Assistant: [name] [name]'s medication list and allergies were reviewed with [name] [name] and were updated in [name] Medication and Allergy Lists.  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 Jude pacemakerdual-chamber pacemaker Diastolic dysfunction Normal Cardiolite study in 2008with normal LV function Hypertension Diabetes mellitus Dyslipidemia Metabolic syndrome Chronic dyspnea felt to be secondary to diastolic dysfunction Obesity History of osteoarthritis and gout  Past Surgical History: Last updated: 12/24/2008 remote history of ovarian cyst removal Breast biopsy Hemorrhoidectomy Hysterectomy  Family History: Last updated: 08/18/2009 Family History of Cancer:  Family History of CVA or Stroke:  Heart CHF  Social History: Last updated: 08/18/2009 Retired  Widowed  Tobacco Use - No.  Alcohol Use - no Regular Exercise - no Drug Use - no  Risk Factors: Smoking Status: never (07/23/2010)  Vital Signs:  [name] profile:   77 year old female Height:      60 inches Weight:      206 pounds O2 Sat:      93 % on Room air Pulse rate:   71 / minute BP sitting:   121 / 69  (left arm) Cuff size:   large  Vitals  Entered By: Carlye Grippe (July 23, 2010 1:23 PM)  O2 Flow:  Room air  Physical Exam  Additional Exam:  General: Well-developed, well-nourished in no distress head: Normocephalic and atraumatic eyes PERRLA/EOMI intact, conjunctiva and lids normal nose: No deformity or lesions mouth normal dentition, normal posterior pharynx neck: Supple, no JVD.  No  masses, thyromegaly or abnormal cervical nodes lungs: Normal breath sounds bilaterally without wheezing.  Normal percussion heart: regular rate and rhythm with normal S1 and S2, no S3 or S4.  PMI is normal.  No pathological murmurs abdomen: Normal bowel sounds, abdomen is soft and nontender without masses, organomegaly or hernias noted.  No hepatosplenomegaly musculoskeletal: Back normal, normal gait muscle strength and tone normal pulsus: Pulse is normal in all 4 extremities Extremities: 1+ peripheral pitting edema neurologic: Alert and oriented x 3 skin: Intact without lesions or rashes cervical nodes: No significant adenopathy psychologic: Normal affect    PPM Specifications Following MD:  Sherryl Manges, MD     PPM Vendor:  St Jude     PPM Model Number:  [id number]     PPM Serial Number:  [id number] DOI:  04/19/2007     PPM Implanting MD:  Sherryl Manges, MD  Lead 1    Location: RA     DOI: 04/19/2007     Model #: [id number]     Serial #: [id number]     Status: active Lead 2    Location: RV     DOI: 04/19/2007     Model #: 1788TC     Serial #: [id number]     Status: active  Magnet Response Rate:  BOL 98.6 ERI  86.3  Indications:  Syncope; tri-fasicular block   PPM Follow Up Pacer Dependent:  No      Episodes Coumadin:  No  Parameters Mode:  DDD     Lower Rate Limit:  50     Upper Rate Limit:  105 Paced AV Delay:  300     Sensed AV Delay:  275  Impression & Recommendations:  Problem # 1:  SHORTNESS OF BREATH (ICD-786.05) [name] [name] is severely deconditioned. I recommended weight reduction. I prescribed a walker for her so she can start exercising. [name] [name] also could benefit from formal cardiac rehabilitation. Her son states that he will pay for this. No further cardiac workup his wife right now Her updated medication list for this problem includes:    Lisinopril 20 Mg Tabs (Lisinopril) .Marland Kitchen... 1 tablet by mouth once a day    Verapamil Hcl Cr 120 Mg Xr24h-cap (Verapamil hcl) .Marland Kitchen... Take  one capsule by mouth twice daily    Aspirin 81 Mg Tbec (Aspirin) .Marland Kitchen... Take one tablet by mouth daily    Furosemide 20 Mg Tabs (Furosemide) .Marland Kitchen... Take one tablet by mouth daily as needed    Hydrochlorothiazide 25 Mg Tabs (Hydrochlorothiazide) .Marland Kitchen... Take one tablet by mouth daily.  Problem # 2:  CHRONIC DIASTOLIC HEART FAILURE (ICD-428.32) Assessment: Comment Only stable Her updated medication list for this problem includes:    Lisinopril 20 Mg Tabs (Lisinopril) .Marland Kitchen... 1 tablet by mouth once a day    Verapamil Hcl Cr 120 Mg Xr24h-cap (Verapamil hcl) .Marland Kitchen... Take one capsule by mouth twice daily    Aspirin 81 Mg Tbec (Aspirin) .Marland Kitchen... Take one tablet by mouth daily    Furosemide 20 Mg Tabs (Furosemide) .Marland Kitchen... Take one tablet by mouth daily as needed    Hydrochlorothiazide 25 Mg Tabs (Hydrochlorothiazide) .Marland Kitchen... Take one tablet by mouth  daily.  Problem # 3:  CARDIAC PACEMAKER IN SITU (ICD-V45.01) normal pacemaker function  Problem # 4:  ESSENTIAL HYPERTENSION, BENIGN (ICD-401.1) blood pressure is well controlled. Her updated medication list for this problem includes:    Lisinopril 20 Mg Tabs (Lisinopril) .Marland Kitchen... 1 tablet by mouth once a day    Verapamil Hcl Cr 120 Mg Xr24h-cap (Verapamil hcl) .Marland Kitchen... Take one capsule by mouth twice daily    Aspirin 81 Mg Tbec (Aspirin) .Marland Kitchen... Take one tablet by mouth daily    Furosemide 20 Mg Tabs (Furosemide) .Marland Kitchen... Take one tablet by mouth daily as needed    Hydrochlorothiazide 25 Mg Tabs (Hydrochlorothiazide) .Marland Kitchen... Take one tablet by mouth daily.  [name] Instructions: 1)  Your physician wants you to follow-up in: 6 months. You will receive a reminder letter in [name] mail one-two months in advance. If you don't receive a letter, please call our office to schedule [name] follow-up appointment. 2)  Your physician recommends that you continue on your current medications as directed. Please refer to [name] Current Medication list given to you today. Prescriptions: Lyda Perone with a seat and brakes  #1 x 0   Entered by:   Cyril Loosen, RN, BSN   Authorized by:   Lewayne Bunting, MD, [facility]   Signed by:   Cyril Loosen, RN, BSN on 07/23/2010   Method used:   Print then Give to [name]   RxID:   [id number]

## 2011-01-28 NOTE — Assessment & Plan Note (Signed)
Summary: pc2   Visit Type:  Pacemaker check Primary Provider:  Tapper  CC:  pacer check.  History of Present Illness: The patient presents today for routine electrophysiology followup. She reports doing very well since last being seen in our clinic.   She continues to have chronic dypnea which is not worse.  The patient denies symptoms of palpitations, chest pain,  lower extremity edema, dizziness, presyncope, syncope, or neurologic sequela. The patient is tolerating medications without difficulties and is otherwise without complaint today.   Preventive Screening-Counseling & Management  Alcohol-Tobacco     Smoking Status: never  Current Medications (verified): 1)  Lisinopril 20 Mg Tabs (Lisinopril) .Marland Kitchen.. 1 Tablet By Mouth Once A Day 2)  Verapamil Hcl Cr 120 Mg Xr24h-Cap (Verapamil Hcl) .... Take One Capsule By Mouth Twice Daily 3)  Metformin Hcl 500 Mg Tabs (Metformin Hcl) .... Two Times A Day 4)  Pravastatin Sodium 40 Mg Tabs (Pravastatin Sodium) .... Take One Tablet By Mouth Daily At Bedtime 5)  Calcium + D 600-200 Mg-Unit Tabs (Calcium Carbonate-Vitamin D) .... Once Daily 6)  Aspirin 81 Mg Tbec (Aspirin) .... Take One Tablet By Mouth Daily 7)  Potassium Chloride Crys Cr 20 Meq Cr-Tabs (Potassium Chloride Crys Cr) .... Take Two Tablet By Mouth Two Times A Day 8)  Multivitamins   Tabs (Multiple Vitamin) .... Once Daily 9)  Ibuprofen 200 Mg Tabs (Ibuprofen) .... As Needed 10)  Naprosyn 500 Mg Tabs (Naproxen) .... As Needed 11)  Glipizide 5 Mg Tabs (Glipizide) .... Take 2 Tablets Daily As Needed 12)  Furosemide 20 Mg Tabs (Furosemide) .... Take One Tablet By Mouth Daily As Needed 13)  Hydrochlorothiazide 25 Mg Tabs (Hydrochlorothiazide) .... Take One Tablet By Mouth Daily. 14)  Advair Diskus 500-50 Mcg/dose Aepb (Fluticasone-Salmeterol) .... One Inhaltion Two Times A Day  Allergies (verified): No Known Drug Allergies  Comments:  Nurse/Medical Assistant: The patient's medications  and allergies were reviewed with the patient and were updated in the Medication and Allergy Lists. List reviewed that didn't have any doses.  Past History:  Past Medical History: Reviewed history from 12/24/2008 and no changes required. 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 Jude pacemakerdual-chamber pacemaker Diastolic dysfunction Normal Cardiolite study in 2008with normal LV function Hypertension Diabetes mellitus Dyslipidemia Metabolic syndrome Chronic dyspnea felt to be secondary to diastolic dysfunction Obesity History of osteoarthritis and gout  Past Surgical History: Reviewed history from 12/24/2008 and no changes required. remote history of ovarian cyst removal Breast biopsy Hemorrhoidectomy Hysterectomy  Vital Signs:  Patient profile:   77 year old female Height:      60 inches Weight:      198 pounds Pulse rate:   84 / minute BP sitting:   122 / 68  (left arm) Cuff size:   large  Vitals Entered By: Carlye Grippe (March 20, 2010 2:08 PM) CC: pacer check   Physical Exam  General:  morbidy obese, NAD Head:  normocephalic and atraumatic Eyes:  PERRLA/EOM intact; conjunctiva and lids normal. Mouth:  Teeth, gums and palate normal. Oral mucosa normal. Neck:  Neck supple, no JVD. No masses, thyromegaly or abnormal cervical nodes. Lungs:  clear Heart:  RRR, no m/r/g Abdomen:  Bowel sounds positive; abdomen soft and non-tender without masses, organomegaly, or hernias noted. No hepatosplenomegaly. Msk:  walks with a cane Pulses:  pulses normal in all 4 extremities Extremities:  no edema Neurologic:  Alert and oriented x 3. Skin:  Intact without lesions or rashes.  PPM Specifications Following MD:  Sherryl Manges, MD     PPM Vendor:  St Jude     PPM Model Number:  [id number]     PPM Serial Number:  [id number] PPM DOI:  04/19/2007     PPM Implanting MD:  Sherryl Manges, MD  Lead 1    Location: RA     DOI: 04/19/2007     Model  #: 1699TC     Serial #: [id number]     Status: active Lead 2    Location: RV     DOI: 04/19/2007     Model #: 1788TC     Serial #: [id number]     Status: active  Magnet Response Rate:  BOL 98.6 ERI  86.3  Indications:  Syncope; tri-fasicular block   PPM Follow Up Remote Check?  No Battery Voltage:  2.78 V     Battery Est. Longevity:  10 years     Pacer Dependent:  No       PPM Device Measurements Atrium  Amplitude: 1.8 mV, Impedance: 351 ohms, Threshold: 0.75 V at 0.4 msec Right Ventricle  Amplitude: 7.3 mV, Impedance: 33 ohms, Threshold: 0.875 V at 0.4 msec  Episodes MS Episodes:  0     Percent Mode Switch:  0     Coumadin:  No Atrial Pacing:  12     Ventricular Pacing:  35  Parameters Mode:  DDD     Lower Rate Limit:  50     Upper Rate Limit:  105 Paced AV Delay:  300     Sensed AV Delay:  275 Next Cardiology Appt Due:  08/27/2010 Tech Comments:  Autocapture reprogrammed.  TTM's with [employer].  ROV 6 months in [place] clinic. Altha Harm, LPN  March 20, 2010 2:50 PM  MD Comments:  agree,  no afib or Ventricular arrhythmias  Impression & Recommendations:  Problem # 1:  CHRONIC DIASTOLIC HEART FAILURE (ICD-428.32) stable no changes today weight loss would help her SOB significantly  Problem # 2:  TRIFASCICULAR BLOCK (ICD-426.54) normal pacemaker function no changes today  Problem # 3:  ESSENTIAL HYPERTENSION, BENIGN (ICD-401.1) stable no changes  Problem # 4:  CARDIAC PACEMAKER IN SITU (ICD-V45.01) normal function no further VT no atrial arrhythmias

## 2011-01-28 NOTE — Letter (Signed)
Summary: External Correspondence/ PROGRESS NOTE DR. GARY CARL  External Correspondence/ PROGRESS NOTE DR. GARY CARL   Imported By: Dorise Hiss 10/30/2010 08:38:08  _____________________________________________________________________  External Attachment:    Type:   Image     Comment:   External Document

## 2011-02-03 NOTE — Assessment & Plan Note (Signed)
Summary: pacer clinic   Current Medications (verified): 1)  Lisinopril 20 Mg Tabs (Lisinopril) .Marland Kitchen.. 1 Tablet By Mouth Once A Day 2)  Verapamil Hcl Cr 120 Mg Xr24h-Cap (Verapamil Hcl) .... Take One Capsule By Mouth Twice Daily 3)  Metformin Hcl 500 Mg Tabs (Metformin Hcl) .... Take 1 Tablet By Mouth Two Times A Day 4)  Pravastatin Sodium 40 Mg Tabs (Pravastatin Sodium) .... Take One Tablet By Mouth Daily At Bedtime 5)  Calcium + D 600-200 Mg-Unit Tabs (Calcium Carbonate-Vitamin D) .... Once Daily 6)  Aspirin 81 Mg Tbec (Aspirin) .... Take One Tablet By Mouth Daily 7)  Potassium Chloride Cr 8 Meq Cr-Tabs (Potassium Chloride) .... Take 2 Tablet By Mouth Two Times A Day 8)  Multivitamins   Tabs (Multiple Vitamin) .... Once Daily 9)  Naprosyn 500 Mg Tabs (Naproxen) .... As Needed 10)  Glipizide 5 Mg Tabs (Glipizide) .... Take 2 Tablets Daily As Needed 11)  Furosemide 20 Mg Tabs (Furosemide) .... Take One Tablet By Mouth Daily As Needed 12)  Hydrochlorothiazide 25 Mg Tabs (Hydrochlorothiazide) .... Take One Tablet By Mouth Daily. 13)  Acetaminophen 500 Mg Caps (Acetaminophen) .... As Needed 14)  Fish Oil 1000 Mg Caps (Omega-3 Fatty Acids) .... Take 1 Tablet By Mouth Three Times A Day  Allergies (verified): No Known Drug Allergies   PPM Specifications Following MD:  Sherryl Manges, MD     PPM Vendor:  St Jude     PPM Model Number:  [id number]     PPM Serial Number:  [id number] DOI:  04/19/2007     PPM Implanting MD:  Sherryl Manges, MD  Lead 1    Location: RA     DOI: 04/19/2007     Model #: 1699TC     Serial #: [id number]     Status: active Lead 2    Location: RV     DOI: 04/19/2007     Model #: 1788TC     Serial #: [id number]     Status: active  Magnet Response Rate:  BOL 98.6 ERI  86.3  Indications:  Syncope; tri-fasicular block   PPM Follow Up Battery Voltage:  2.78 V     Pacer Dependent:  No       PPM Device Measurements Atrium  Amplitude: 1.8 mV, Impedance: 326 ohms, Threshold: 0.750 V at  0.4 msec Right Ventricle  Amplitude: 8.0 mV, Impedance: 337 ohms, Threshold: 0.750 V at 0.4 msec  Episodes MS Episodes:  0     Percent Mode Switch:  0     Coumadin:  No Ventricular High Rate:  0     Atrial Pacing:  1.6%     Ventricular Pacing:  26%  Parameters Mode:  DDD     Lower Rate Limit:  50     Upper Rate Limit:  105 Paced AV Delay:  300     Sensed AV Delay:  275 Next Cardiology Appt Due:  06/28/2011 Tech Comments:  NORMAL DEVICE FUNCTION.  NO EPISODES OR MODE SWITCHES.  CHANGED RA OUTPUT FROM 1.625 TO 1.75 V.  ROV IN 6 MTHS W/JA. Vella Kohler  January 20, 2011 11:42 AM

## 2011-02-03 NOTE — Cardiovascular Report (Signed)
Summary: Office Visit   Office Visit   Imported By: Roderic Ovens 01/25/2011 16:04:23  _____________________________________________________________________  External Attachment:    Type:   Image     Comment:   External Document

## 2011-04-09 ENCOUNTER — Other Ambulatory Visit: Payer: Self-pay | Admitting: Cardiology

## 2011-04-12 LAB — CULTURE, BLOOD (ROUTINE X 2): Culture: NO GROWTH

## 2011-04-12 LAB — BASIC METABOLIC PANEL
BUN: 15 mg/dL (ref 6–23)
BUN: 16 mg/dL (ref 6–23)
BUN: 21 mg/dL (ref 6–23)
CO2: 25 mEq/L (ref 19–32)
CO2: 27 mEq/L (ref 19–32)
Calcium: 8.3 mg/dL — ABNORMAL LOW (ref 8.4–10.5)
Calcium: 8.7 mg/dL (ref 8.4–10.5)
Calcium: 8.7 mg/dL (ref 8.4–10.5)
Calcium: 9 mg/dL (ref 8.4–10.5)
Chloride: 106 mEq/L (ref 96–112)
Chloride: 98 mEq/L (ref 96–112)
Creatinine, Ser: 1.01 mg/dL (ref 0.4–1.2)
GFR calc Af Amer: 35 mL/min — ABNORMAL LOW (ref >60)
GFR calc non Af Amer: 29 mL/min — ABNORMAL LOW (ref >60)
GFR calc non Af Amer: 43 mL/min — ABNORMAL LOW (ref >60)
GFR calc non Af Amer: 53 mL/min — ABNORMAL LOW (ref >60)
GFR calc non Af Amer: >60 mL/min (ref >60)
Glucose, Bld: 152 mg/dL — ABNORMAL HIGH (ref 70–99)
Glucose, Bld: 179 mg/dL — ABNORMAL HIGH (ref 70–99)
Glucose, Bld: 179 mg/dL — ABNORMAL HIGH (ref 70–99)
Glucose, Bld: 186 mg/dL — ABNORMAL HIGH (ref 70–99)
Glucose, Bld: 209 mg/dL — ABNORMAL HIGH (ref 70–99)
Potassium: 2.9 mEq/L — ABNORMAL LOW (ref 3.5–5.1)
Potassium: 3.7 mEq/L (ref 3.5–5.1)
Potassium: 4 mEq/L (ref 3.5–5.1)
Sodium: 134 mEq/L — ABNORMAL LOW (ref 135–145)
Sodium: 134 mEq/L — ABNORMAL LOW (ref 135–145)
Sodium: 135 mEq/L (ref 135–145)
Sodium: 140 mEq/L (ref 135–145)

## 2011-04-12 LAB — CARDIAC PANEL(CRET KIN+CKTOT+MB+TROPI)
CK, MB: 3.8 ng/mL (ref 0.3–4.0)
Relative Index: 3.2 — ABNORMAL HIGH (ref 0.0–2.5)
Total CK: 92 U/L (ref 7–177)

## 2011-04-12 LAB — GLUCOSE, CAPILLARY
Glucose-Capillary: 118 mg/dL — ABNORMAL HIGH (ref 70–99)
Glucose-Capillary: 150 mg/dL — ABNORMAL HIGH (ref 70–99)
Glucose-Capillary: 161 mg/dL — ABNORMAL HIGH (ref 70–99)
Glucose-Capillary: 239 mg/dL — ABNORMAL HIGH (ref 70–99)

## 2011-04-12 LAB — POCT I-STAT 3, VENOUS BLOOD GAS (G3P V)
Acid-base deficit: 1 mmol/L (ref 0.0–2.0)
Acid-base deficit: 2 mmol/L (ref 0.0–2.0)
Bicarbonate: 24.6 mEq/L — ABNORMAL HIGH (ref 20.0–24.0)
O2 Saturation: 53 %
O2 Saturation: 64 %
pCO2, Ven: 44.9 mmHg — ABNORMAL LOW (ref 45.0–50.0)
pCO2, Ven: 47.3 mmHg (ref 45.0–50.0)
pH, Ven: 7.324 — ABNORMAL HIGH (ref 7.250–7.300)
pH, Ven: 7.394 — ABNORMAL HIGH (ref 7.250–7.300)
pO2, Ven: 31 mmHg (ref 30.0–45.0)

## 2011-04-12 LAB — CBC
HCT: 36.9 % (ref 36.0–46.0)
HCT: 37.3 % (ref 36.0–46.0)
Hemoglobin: 12.2 g/dL (ref 12.0–15.0)
Hemoglobin: 12.5 g/dL (ref 12.0–15.0)
Hemoglobin: 14.2 g/dL (ref 12.0–15.0)
MCHC: 33.1 g/dL (ref 30.0–36.0)
MCHC: 33.1 g/dL (ref 30.0–36.0)
MCHC: 33.5 g/dL (ref 30.0–36.0)
MCV: 89.6 fL (ref 78.0–100.0)
MCV: 90.6 fL (ref 78.0–100.0)
Platelets: 180 10*3/uL (ref 150–400)
Platelets: 193 10*3/uL (ref 150–400)
RDW: 13.7 % (ref 11.5–15.5)
RDW: 14 % (ref 11.5–15.5)
RDW: 14.1 % (ref 11.5–15.5)
RDW: 14.3 % (ref 11.5–15.5)

## 2011-04-12 LAB — POCT I-STAT 3, ART BLOOD GAS (G3+)
Acid-Base Excess: 2 mmol/L (ref 0.0–2.0)
Bicarbonate: 26.5 mEq/L — ABNORMAL HIGH (ref 20.0–24.0)
O2 Saturation: 91 %
pO2, Arterial: 61 mmHg — ABNORMAL LOW (ref 80.0–100.0)

## 2011-04-12 LAB — DIFFERENTIAL
Basophils Absolute: 0 10*3/uL (ref 0.0–0.1)
Basophils Absolute: 0 10*3/uL (ref 0.0–0.1)
Basophils Absolute: 0 10*3/uL (ref 0.0–0.1)
Basophils Relative: 0 % (ref 0–1)
Basophils Relative: 0 % (ref 0–1)
Basophils Relative: 1 % (ref 0–1)
Eosinophils Absolute: 0.1 10*3/uL (ref 0.0–0.7)
Eosinophils Relative: 1 % (ref 0–5)
Eosinophils Relative: 3 % (ref 0–5)
Eosinophils Relative: 4 % (ref 0–5)
Lymphocytes Relative: 25 % (ref 12–46)
Monocytes Absolute: 0.6 10*3/uL (ref 0.1–1.0)
Monocytes Absolute: 0.6 10*3/uL (ref 0.1–1.0)
Monocytes Absolute: 1.1 10*3/uL — ABNORMAL HIGH (ref 0.1–1.0)
Monocytes Relative: 15 % — ABNORMAL HIGH (ref 3–12)
Neutro Abs: 4.3 10*3/uL (ref 1.7–7.7)
Neutro Abs: 5.7 10*3/uL (ref 1.7–7.7)

## 2011-04-12 LAB — URINALYSIS, ROUTINE W REFLEX MICROSCOPIC
Bilirubin Urine: NEGATIVE
Glucose, UA: NEGATIVE mg/dL
Nitrite: NEGATIVE
Specific Gravity, Urine: 1.015 (ref 1.005–1.030)
pH: 8 (ref 5.0–8.0)

## 2011-04-12 LAB — URINE MICROSCOPIC-ADD ON

## 2011-04-12 LAB — URINE CULTURE: Special Requests: NEGATIVE

## 2011-04-12 LAB — BRAIN NATRIURETIC PEPTIDE: Pro B Natriuretic peptide (BNP): 257 pg/mL — ABNORMAL HIGH (ref 0.0–100.0)

## 2011-04-12 LAB — APTT: aPTT: 31 seconds (ref 24–37)

## 2011-05-05 ENCOUNTER — Encounter: Payer: Self-pay | Admitting: Internal Medicine

## 2011-05-05 DIAGNOSIS — I459 Conduction disorder, unspecified: Secondary | ICD-10-CM

## 2011-05-11 NOTE — Cardiovascular Report (Signed)
NAMEBELISSA, KOOY                ACCOUNT NO.:  [id number]   MEDICAL RECORD NO.:  [id number]          PATIENT TYPE:  INP   LOCATION:  4705                         FACILITY:  [facility]   PHYSICIAN:  Bevelyn Buckles. Bensimhon, MDDATE OF BIRTH:  05-Sep-1927   DATE OF PROCEDURE:  01/03/2009  DATE OF DISCHARGE:                            CARDIAC CATHETERIZATION   PRIMARY CARDIOLOGIST:  Learta Codding, M.D., [facility].   PATIENT IDENTIFICATION:  Ms. Burrough is a very pleasant 77 year old woman  with a history of diabetes, hypertension, hyperlipidemia as well as  trifascicular block with symptomatic bradycardia, status post pacemaker,  who has been experiencing severe exertional dyspnea.  She underwent a  dobutamine echo at [facility].  This was complicated by sustained  ventricular tachycardia with rates up in the 270s.  She is brought for  right and left heart catheterization.   PROCEDURES PERFORMED:  1. Right heart cath.  2. Selective coronary angiography.  3. Left heart cath.  4. Left ventriculogram.  5. StarClose femoral artery closure.   DESCRIPTION OF PROCEDURE:  The risks and indications were explained.  Consent was signed and placed on the chart.  A 7-French venous sheath  was placed in the right femoral vein.  A standard Swan-Ganz catheter was  used for the right heart cath.  A 5-French arterial sheath was placed in  the right femoral artery using a modified Seldinger technique.  A JL-4  was used to image the left coronary system.  We had extensive difficulty  attempting to cannulate the right coronary artery.  We used multiple  catheters.  Finally, we did get close with an AL I although never  selectively engaged the right coronary.  It appeared that the right  coronary had a low anterior takeoff.  We also used a pigtail catheter  for the left ventriculogram.  We did a hand injection in order to limit  dye exposure as much as possible given the effort it took to evaluate  the right coronary.   There were no apparent complications.   TOTAL CONTRAST USED:  160 mL.   Right atrial pressure mean of 5, RV 38/3.  PA 38/14 with a mean of 23.  Pulmonary capillary wedge mean of 8.  Central aortic pressure 135/64  with a mean of 90.  LV pressure 151/14 with an EDP of 20.  Fick cardiac  output 6.3 liters per minute.  Cardiac index 3.3 liters per minute per  meter squared.   Left main was normal.   LAD gave off a large branching diagonal that was angiographically  normal.   Left circumflex gave a large OM-1, small OM-2, and a moderate-sized  posterolateral.  There was a mildly aneurysmal segment in the distal AV  groove circumflex.  There was TIMI III flow through it.   Right coronary artery as above had anomalous origin, it was never imaged  selectively.  It had a low anterior takeoff.  There appeared to be a 40%  ostial stenosis followed by a 40-50% tubular stenosis in the proximal  portion.  There was a small aneurysmal section in the midsection,  otherwise is free of significant disease.   Left ventriculogram done by hand injection had poor opacification of the  ventricle.  Ejection fraction appeared to be about 55%, question of  inferior apical hypokinesis.   ASSESSMENT:  1. Nonobstructive coronary artery disease with small aneurysmal      sections in the RCA and left circumflex which are non-flow      limiting.  2. 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  3. Grossly normal left ventricular function with question of inferior      apical hypokinesis.   Plan will be for medical therapy though I have asked electrophysiology  to see her regarding the need for a possible EP study.  Will check an  echocardiogram and PFTs to further evaluate.      Bevelyn Buckles. Bensimhon, MD  Electronically Signed     DRB/MEDQ  D:  01/03/2009  T:  01/03/2009  Job:  [id number]

## 2011-05-11 NOTE — Letter (Signed)
February 20, 2009    Learta Codding, MD,FACC  [address]  [place], [place] [address]   RE:  [name] [name], [name] [name]  MRN:  [id number]  /  DOB:  1927-01-11   Dear Michelle Piper:   [name] [name] comes back in following her recent evaluation for dyspnea,  which was associated with repetitive monomorphic VT.  She was put on  verapamil and interrogation of her device demonstrates no recurrent  events.   She continues to complain of significant shortness of breath, however.  She dates this back to the beginning of the summer.  She underwent  pulmonary function testing, which demonstrated a mild obstructive  defect, a moderate restrictive defect and a moderate decrease in her  DLCO.  She also had a sleep study, which apparently was associated with  a significant improvement with the temporary use of a mask.   She has pursued ongoing diuresis, but unfortunately she has continued to  have shortness of breath despite the diuresis.   MEDICATIONS:  1. Lisinopril 80 which she is alternating somewhat with 40.      Lisinopril 20.  2. Verapamil 120.  3. Aspirin.  4. Pravastatin.  5. Potassium.  6. Glipizide.  7. Metformin.   PHYSICAL EXAMINATION:  VITAL SIGNS:  Today her blood pressure was  125/66, her pulse was 61, and her weight was 209, which is stable over  the last essentially 2 years.  NECK:  Veins were hard to discern.  LUNGS:  Clear with some decreased breath sounds.  HEART:  Sounds were regular.  ABDOMEN:  Soft.  EXTREMITIES:  Just trace edema.   Interrogation of her pacemaker demonstrated no intercurrent VT.  Her St.  Jude device demonstrated P-wave of 1.4.  The impedance was 337.  The  threshold was 0.625 at 0.5.  The R-wave was 4.  The impedance was 326.  The threshold was 0.75 at 0.5.  Battery voltage was 2.8 and estimated  longevity was about 10 years after the electrograms were turned off.   IMPRESSION:  1. Syncope with bradycardia.  2. Status post pacer for the above.  3. Significant  exercise intolerance secondary to dyspnea, question      cause.  4. Modest pulmonary defects.  5. Normal left ventricular function.   IDENTIFICATION:  Michelle Piper, [name] [name] has continued to complain of a  significant exercise impairment.  I do not know if there is anything  further we can do from a rhythm monitoring point of view.  I wonder  whether she would not be a candidate for pulmonary rehabilitation, but I  will defer that to your expertise.   I further suggest that she decrease her Lasix from 80 a day to 40 a day  and use her peripheral edema as a trigger for increasing her daily dose.  This may help improve her quality of life.   Thanks for allowing [place] to help.    Sincerely,      Duke Salvia, MD, [facility]  Electronically Signed    SCK/MedQ  DD: 02/20/2009  DT: 02/20/2009  Job #: [id number]   CC:    Wyvonnia Lora

## 2011-05-11 NOTE — Assessment & Plan Note (Signed)
[facility]                          [facility]   NAME:Canto, RAYCHELL HOLCOMB                       MRN:          [id number]  DATE:01/23/2009                            DOB:          [birth year]    PRIMARY CARDIOLOGIST:  Learta Codding, MD, [facility]   PRIMARY ELECTROPHYSIOLOGIST:  Duke Salvia, MD, [facility], in [place].   REASON FOR VISIT:  Post hospital followup.   Ms. Arnott returns to our clinic, following recent hospitalization at  [facility].  This was following development of sustained, monomorphic  ventricular tachycardia during dobutamine stress echocardiography  testing.  I had ordered this study for further evaluation of profound  exertional dyspnea, as well as to rule out chronotropic incompetence.  She was referred emergently to Dr. Andee Lineman, who initiated treatment with  intravenous lidocaine and IV Lopressor.  She subsequently reverted back  to normal sinus rhythm and, following stabilization, was transferred  directly to [facility].   The patient underwent subsequent right/left coronary angiography, by Dr.  Gala Romney, noting nonobstructive CAD and small aneurysmal sections in  the RCA and CFX.  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  Left ventricular function was preserved (EF 55%), but with question of  inferior apical hypokinesis.  Of note, her LVEDP was 20.   The patient was then referred to Dr. Sherryl Manges for further  recommendations.  Her pacemaker was reprogrammed, although details are  currently unavailable, and he also substituted her beta-blocker with  verapamil.  He also recommended IV diuresis.  Of note, the patient was  discharged home on Lasix 20 mg daily and taken off lisinopril/HCTZ,  which she had been on at home.   Additional workup consisted of outpatient pulmonary testing, with both  PFTs and a formal sleep study.  The former indicated mild obstructive  lung disease with a mild/moderate restrictive  defect and moderate  decrease in DLCO.  Sleep study suggested mild obstructive sleep apnea,  and the patient is currently awaiting settings for CPAP.   Clinically, the patient reports no significant change in her persistent,  profound exertional dyspnea.   CURRENT MEDICATIONS:  1. Lisinopril 10 mg daily.  2. Lasix 20 mg daily.  3. Verapamil 120 mg b.i.d.  4. Aspirin 81 mg daily.  5. Pravastatin 40 mg daily.  6. Potassium 16 mEq b.i.d.  7. Glipizide 5 mg b.i.d.  8. Metformin 500 mg b.i.d.   PHYSICAL EXAMINATION:  VITAL SIGNS:  Blood pressure 158/83, pulse 67,  regular weight 211 (up 4).  GENERAL:  An 77 year old female sitting upright, no distress.  HEENT:  Normocephalic, atraumatic.  NECK:  No evident JVD.  LUNGS:  Faint, diffuse expiratory wheezes, no crackles.  HEART:  Regular rate and rhythm.  No significant murmurs.  No rubs.  ABDOMEN:  Protuberant, nontender.  EXTREMITIES:  Right groin stable with no ecchymosis, hematoma, or bruit  on auscultation; tactile femoral pulse.  There is 1+ bilateral,  nonpitting edema.  NEUROLOGIC:  No focal deficit.   IMPRESSION:  1. Persistent, profound exertional dyspnea.      a.  Nonobstructive CAD with small, aneurysmal sections of RCA       and CFX.      b.     Preserved LVF with elevated LVEDP (20).  2. Status post wide complex tachycardia.      a.     Presumed secondary to atrial tachycardia with aberrancy, as       per pacemaker interrogation.  3. Trifascicular block/syncope.      a.     Status post St. Jude dual-chamber pacemaker, April 2008.  4. Mild obstructive sleep apnea.  5. Morbid obesity.  6. Probable diastolic heart failure.  7. Hypertension, uncontrolled.  8. Dyslipidemia.  9. Chronic lower extremity edema.   PLAN:  1. Following review by Dr. Andee Lineman, recommendation is as follows:      Aggressive diuretic management is advised for treatment of volume      overload and probable diastolic heart failure.  We suspect  that      although the patient does have some underlying lung disease, there      is probably a significant contributory effect from her diastolic      heart failure, as noted by the elevated LVEDP.  Therefore, we will      up titrate Lasix to 80 mg daily.  Concomitantly, we will increase      ACE inhibitor to 20 mg daily.  Of note, the patient is reporting a      dry, nonproductive cough.  We will need to monitor this closely so      as to not exacerbate this.  2. Return visit in 1 week for followup BMET and blood pressure check      with our RN staff.  3. Atrovent MDI p.r.n.  4. Klonopin 0.25 mg b.i.d. p.r.n. for anxiety, #60 with no refills.  5. The patient is to follow up with Dr. Sherryl Manges here in the [facility], as      previously scheduled.  6. Return clinic visit with myself and Dr. Andee Lineman in 2 months.      Gene Serpe, PA-C  Electronically Signed      Learta Codding, MD,FACC  Electronically Signed   GS/MedQ  DD: 01/23/2009  DT: 01/24/2009  Job #: [id number]

## 2011-05-11 NOTE — Cardiovascular Report (Signed)
[facility]                   [facility]IC NOTE   NAME:[name] [name], [name] [name]                       MRN:          [id number]  DATE:02/23/2008                            DOB:          [birth year]    [name] [name] is seen following pacemaker implantation for Adams-Stokes  attacks undertaken by Dr. Juanda Chance about a year ago.  She has had no  recurrent syncope.   She has no complaints of chest pain.  She does have exercise intolerance  which is quite significant.  Review of her data includes an echo from  April of 2008, demonstrating normal left ventricular function.   MEDICATIONS:  Include Glucophage, glipizide, Vytorin, metformin,  potassium hydrochlorothiazide/triamterene.   PHYSICAL EXAMINATION:  On examination her blood pressure is 131/87 with  a pulse of 80.  Her lungs were clear.  Her neck veins were flat.  Her heart sounds were regular.  Extremities had 1+ edema.   Interrogation of her St. Jude pacemaker demonstrated a P-wave of 1.8  with impedance of 371, a threshold 0.75 at 0.5.  The R-wave was 3.9 with  an impedance of 352 with a threshold 0.75 at 0.5.   IMPRESSION:  1. 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  2. Status post pacemaker for the above with a resolution of syncope      secondary to Adams-Stokes attacks.  3. Dyspnea on exertion with normal left ventricular function -      diastolic failure - chronic.  4. Modest peripheral edema.  5. Diabetes.   [name] is stable from an arrhythmia point of view.  I have asked her  to follow up with Dr. Margo Common about considering up titration of her  diuretics, potentially adding a loop diuretic to see if we cannot in the  setting of her edema improve her volume situation, thereby hopefully her  dyspnea.   We will see her again in six months' time.    Duke Salvia, MD, [facility]  Electronically Signed   SCK/MedQ  DD: 02/23/2008  DT: 02/24/2008  Job #: [id number]   cc:    Wyvonnia Lora

## 2011-05-11 NOTE — Assessment & Plan Note (Signed)
[facility]                         ELECTROPHYSIOLOGY OFFICE NOTE   NAME:[name], [name]                       MRN:          [id number]  DATE:05/19/2007                            DOB:          1927/02/13    Ms. [name] [name] is seen.  She is status post pacemaker implantation by Dr.  Juanda Chance.  She is concerned about the retained suture.  In fact, there is  a retained suture at the medial aspect of the incision.  This was  removed.   We will plan to see her again in two months time for device  interrogation.     Duke Salvia, MD, [facility]  Electronically Signed    SCK/MedQ  DD: 05/19/2007  DT: 05/19/2007  Job #: [id number]

## 2011-05-11 NOTE — Discharge Summary (Signed)
NAMEDENEKA, [name] [name]                ACCOUNT NO.:  [id number]   MEDICAL RECORD NO.:  [id number]          PATIENT TYPE:  INP   LOCATION:  4705                         FACILITY:  [facility]   PHYSICIAN:  Noralyn Pick. Eden Emms, MD, FACCDATE OF BIRTH:  Jun 28, 1927   DATE OF ADMISSION:  01/02/2009  DATE OF DISCHARGE:  01/06/2009                               DISCHARGE SUMMARY   PRIMARY CARDIOLOGIST:  Learta Codding, MD, [facility]   PRIMARY ELECTROPHYSIOLOGIST:  Duke Salvia, MD, [facility]   DISCHARGE DIAGNOSIS:  Sustained wide-complex tachycardia not due to  ischemic coronary artery disease.   SECONDARY DIAGNOSES:  1. Nonobstructive coronary artery disease.  2. Hypertension.  3. Diabetes mellitus.  4. Dyspnea.  5. Dyslipidemia.  6. Progressive dyspnea on exertion.  7. Syncope/trifascicular block, status post St. Jude pacemaker.   ALLERGIES:  No known drug allergies.   PROCEDURES PERFORMED DURING THIS HOSPITALIZATION:  Heart catheterization  (RHC, SCA, LHC, LV gram) with results:  1. Nonobstructive coronary artery disease with small aneurysm of      section in RCA/LCX.  2. Anomalous RCA.  3. Grossly normal LV function with possible apical hypokinesis.   PROCEDURES:  A 2-D echocardiogram from January 03, 2009, that showed LVEF  50-55%.  Hypokinesia of the periapical wall.  LV wall thickness mildly  increased.  Mild mitral valve regurgitation.  Left atrium mildly  dilated.   On January 03, 2009, the patient had electrocardiogram performed that  showed sinus bradycardia with a first degree atrioventricular block at a  rate of 55, also right bundle-branch block with left fascicular block.   T-wave inversion in leads V3-V6, also in II, III, and aVF.  No  significant changes from EKG performed on January 02, 2009.  The patient  had an electrocardiogram performed on January 05, 2009, that showed  normal sinus rhythm with primary atrioventricular block and right bundle-  branch block at the rate of 71.   T-wave inversion in leads V1 through  V6.  No significant changes and also T-wave inversion in leads II, III,  aVF and no significant changes from EKG performed on January 02, 2009.  The patient had a chest x-ray performed on January 04, 2009, which showed  bibasilar atelectasis, low lung volumes and then also left-sided dual  beat pacer noted.  Heart size is normal.   HISTORY OF PRESENT ILLNESS:  On January 02, 2009, the patient was seen  for a stress echocardiographic study, which had been ordered due to the  patient having worsening dyspnea with exertion over the last several  weeks.  In the first couple of minutes of the stress test, the patient  developed PVCs and runs of wide-complex tachycardia.  At that time, the  test was stopped.  This patient's ventricular rate was as high as 270  beats per minute and her blood pressure was as low as 77/54.  The  patient was treated with 15 mg of lidocaine and 5 mg of Lopressor IV and  then 25 mg of Lopressor p.o.  The patient's vital signs stabilized.  The  patient denied chest pain;  however, she was experiencing significant  shortness of breath, palpitations, and weakness.  The patient reported  her symptoms during this episode as been very similar to the symptoms  she had been experiencing with minimal exertion over the last few weeks.  At that time, it was determined that the patient should be transferred  to [facility] for evaluation and possible heart  catheterization.   HOSPITAL COURSE:  The patient underwent right and left heart  catheterization on January 05, 2009, without complications.  It was  determined that the patient should follow up with her primary  electrophysiologist cardiologist, Dr. Graciela Husbands.  The patient also had 2-D  echocardiogram completed.  See procedures completed during this  hospitalization visit which confirmed suspicion that the patient's wide-  complex tachycardia was nonischemic in origin.  Dr. Graciela Husbands saw  the  patient and switched her beta-blocker to Verapamil, recommended IV  diuresis, had her pacemaker interrogated and reprogrammed and  recommended outpatient pulmonary function tests and sleep apnea and then  follow up with her primary cardiologist and him after those tests were  completed.  On January 04, 2009, the patient was found to have slightly  low potassium at 2.9, which was replaced with oral supplements and the  patient had an elevated temp on January 05, 2009 at 102.6.  Origin  unknown, however, it was totally resolved and the patient was  asymptomatic on January 06, 2009.  The patient had her followup  appointment scheduled, given instructions both orally and in written  form as to how to follow up with her pulmonary function tests,  obstructive sleep apnea exam, as well as her appointments with Dr. Graciela Husbands  and Dr. Andee Lineman.  At the time of discharge, the patient had no questions  or concerns.  Vital signs at discharge, temp 97.9 degrees Fahrenheit, BP  92/53, pulse 66, respirations 20, O2 saturation 93% on room air, and  weight 92.2 kg.   DISCHARGE LABS:  WBC 7.1, HGB 12.5, HCT 37.3, and PLT count 157.  Sodium  135, potassium 4.0, chloride 98, CO2 27, glucose 179, BUN 39, creatinine  1.37, calcium 8.4.   FOLLOWUP PLAN AND APPOINTMENTS:  The patient has an appointment to see  Dr. Andee Lineman on January 15, 2009, at 10:30 a.m. and the patient is to see  Dr. Graciela Husbands on January 31, 2009, at 3 p.m.  Still to be scheduled are her  pulmonary function tests and sleep study; however, prior to the patient  leaving these appointments will be made and will be given to the patient  in both oral and written form.   DURATION OF DISCHARGE/ENCOUNTER:  Including physician time was 1 hour.   DISCHARGE MEDICATIONS:  1. Aspirin 81 mg p.o. daily.  2. Lisinopril 20 mg p.o. daily.  3. HCTZ 25 mg p.o. daily.  4. Pravastatin 40 mg p.o. daily.  5. KCl 8 mEq 2 tablets p.o. b.i.d.  6. Glipizide 5 mg p.o.  b.i.d.  7. Metformin 500 mg p.o. b.i.d.  8. Naprosyn 500 p.o. p.r.n. for pain.  9. Verapamil 120 mg p.o. b.i.d.      Jarrett Ables, PAC      Peter C. Eden Emms, MD, [facility]  Electronically Signed    MS/MEDQ  D:  01/06/2009  T:  01/07/2009  Job:  [id number]   cc:   Duke Salvia, MD, [facility]  Learta Codding, MD,FACC

## 2011-05-11 NOTE — Assessment & Plan Note (Signed)
[facility]                          [facility] NOTE   NAME:[name] [name], [name] [name]                       MRN:          [id number]  DATE:12/25/2008                            DOB:          11-03-1927    PRIMARY CARDIOLOGIST:  Learta Codding, MD, [facility].   PRIMARY ELECTROPHYSIOLOGIST:  Duke Salvia, MD, [facility], in [place]   REASON FOR VISIT:  [name] [name] is a delightful 77 year old female, with no  documented history of coronary artery disease, who is followed here in  the [facility] by Dr. Sherryl Manges.  She underwent placement of a St.  Jude dual chamber pacemaker in April 2008, following presentation with  syncope and trifascicular block.  Prior ischemic workup consisted of an  adenosine Cardiolite here at [facility], prior to her transfer to [facility], which yielded no definite ischemia, non-reversible periapical  defect; EF 69%.  A 2-D echo at that time indicated normal LVEF (EF 60-  65%), with no focal wall motion abnormalities.  There was mild TR and  MR, and no definite pulmonary hypertension.   [name] [name] has cardiac risk factors notable for hypertension, type 2  diabetes mellitus, dyslipidemia, and age.  She has never smoked tobacco.   She presents with complaints of worsening exertional shortness of breath  over the past several weeks.  She has undergone a recent evaluation by  Dr. Margo Common, with serial x-rays as well as some blood work.   An x-ray from earlier this month indicated chronic lung changes with a  stable cardiac enlargement and no definite acute findings.  A BNP level  was essentially negative, with a level of 41.  The patient is now  referred for her marked exertional dyspnea, as well as finding of  cardiomegaly by these recent x-rays.   [name] denies any history of exertional angina pectoris, including  most recently.  She did, however, experience some mild anterior chest  tightness upon awakening early one morning, several  weeks ago.  This  resolved spontaneously and she has not had a recurrent symptoms.  When  queried on whether or not she has any type of chest discomfort when  walking up a slight incline, she states that she is only profoundly  short of breath.  She also has chronic lower extremity edema, denies  orthopnea or PND, and has been sleeping with her head elevated for  years.   Most recent pacer check in September 2009 was notable for no recommended  modifications.  She is due to return in 6 months.   An EKG today shows chronic changes with a trifascicular block and normal  sinus rhythm at a rate of 73 bpm.   CURRENT MEDICATIONS:  1. Aspirin 81 daily.  2. Lisinopril/HCTZ 20/25 mg daily.  3. Pravastatin 40 at bedtime.  4. Potassium 8 mEq 2 tablets b.i.d.  5. Glipizide 5 b.i.d.  6. Metformin 500 b.i.d.   PHYSICAL EXAMINATION:  VITAL SIGNS:  Blood pressure 159/85, pulse 72,  regular, and weight of 207.6.  GENERAL:  A 77 year old female, obese, sitting upright, and in no  distress.  HEENT:  Normocephalic and atraumatic.  NECK:  Palpable carotid pulse without bruits; no JVD at 90 degrees.  LUNGS:  Clear to auscultation in all fields.  HEART:  Regular rate and rhythm.  No significant murmurs.  No rubs.  ABDOMEN:  Protuberant, nontender, and intact bowel sounds.  EXTREMITIES:  Palpable dorsalis pedis pulses with 1+ bilateral,  nonpitting edema.  NEUROLOGIC:  No focal deficit.   IMPRESSION:  1. Profound exertional dyspnea.      a.     History of normal LVF by prior echocardiography and       pharmacologic stress testing.  2. Trifascicular block/syncope.      a.     Status post St. Jude dual-chamber pacemaker, April 2008.  3. Multiple cardiac risk factors.  a  Type 2 diabetes mellitus, hypertension, dyslipidemia, and age.  b.  Low-risk adenosine stress Cardiolite; EF 69%, April 2008.  1. Lower extremity edema.  2. Obesity.   PLAN:  1. A 2-D echocardiogram for reassessment of left  ventricular function      and rule out of significant valvular heart disease or evidence of      pulmonary hypertension.  2. Repeat stress testing, this time employing dobutamine modality.      This is to rule out chronotropic incompetence.  Of note, the      patient is not able to walk on a treadmill, citing significant      arthritis of the knees, apart from her marked exertional dyspnea.      If her perfusion images show any suggestion of ischemia, then we      should keep a low threshold for proceeding with a diagnostic      coronary angiogram.  Regarding this, I have suggest that this be      done as a right/left heart catheterization, in the event that her      symptoms may be, essentially, pulmonary.  3. Extensive followup blood work with a complete metabolic profile,      CBC, TSH, BMP, and D-dimer, the latter to exclude pulmonary      embolus.  4. Consider formal pulmonary evaluation, in the event that her      ischemic workup proves negative.  5. Schedule early clinic followup with myself and Dr. Andee Lineman within      the next 2-3 weeks, for      review of study results and further recommendations.  I have also      instructed the patient to contact our office in the event that she      develops recurrent chest discomfort.      Gene Serpe, PA-C  Electronically Signed      Learta Codding, MD,FACC  Electronically Signed   GS/MedQ  DD: 12/25/2008  DT: 12/26/2008  Job #: [id number]   cc:   Wyvonnia Lora

## 2011-05-11 NOTE — Assessment & Plan Note (Signed)
[facility]                          [facility] NOTE   NAME:[name] [name], [name] [name]                       MRN:          [id number]  DATE:04/09/2009                            DOB:          07-19-1927    REFERRING PHYSICIAN:  Wyvonnia Lora   HISTORY OF PRESENT ILLNESS:  The patient is a 77 year old female,  overweight with poor exercise tolerance secondary to deconditioning.  The patient has been diagnosed with obstructive sleep apnea and is now  on CPAP.  Dr. Graciela Husbands questions her level of dyspnea last night.  The  patient has been referred for pulmonary rehab referral.  I do not think  that she has diastolic heart failure at this point in time, nor do I  think that there is significant lung disease but is largely related to  deconditioning.  The patient has severe osteoarthritis and is unable to  move around very much.  He is also plagued by significant vertigo  related to benign positional vertigo.  I have set her up for a referral  for deconditioning exercises.  She denies any chest pain.  She has no  orthopnea, PND, palpitations, or syncope.  She also reports that she had  a recent gouty attack, but is not aware of dietary modifications for  gout, nor has she taken allopurinol nor do we have any uric acid levels.  I have asked her to discuss this with her primary care physician.   MEDICATIONS:  Metformin 500 mg p.o. b.i.d., pravastatin 40 mg p.o.  daily, calcium 600 daily, aspirin 81 mg p.o. daily, lisinopril 20 mg  p.o. daily, potassium 20 mEq 2 tablets p.o. b.i.d., furosemide 20 mg  p.o. daily, multivitamin 1 tablet p.o. daily.   PHYSICAL EXAMINATION:  VITAL SIGNS:  Blood pressure 126/71, heart rate  80 beats per minute, weight is 208 pounds.  GENERAL:  Overweight white female, but in no apparent distress.  HEENT:  Pupils equal, round, reactive to light.  Conjunctivae clear.  NECK:  Supple.  Normal carotid upstrokes.  No carotid bruits.  LUNGS:  Clear breath sounds bilaterally.  HEART:  Regular rate and rhythm with normal S1 and S2.  No murmurs,  rubs, or gallops.  ABDOMEN:  Soft, nontender.  No rebound or guarding.  Good bowel sounds.  EXTREMITIES:  No cyanosis, clubbing, or edema.   PROBLEM LIST:  1. Gouty arthritis.  2. Exertional dyspnea secondary to deconditioning and normal ejection      fraction.  3. History of trifascicular block with syncope requiring St. Jude dual-      chamber pacemaker.  4. Type 2 diabetes mellitus.  5. Low-risk adenosine Cardiolite study April 2008.  6. Benign positional vertigo.  7. Obstructive sleep apnea.   PLAN:  1. The patient will be referred to Lanetta Inch wife, for      deconditioning exercises for benign positional vertigo.  2. Refer the patient for pulmonary rehab, but I think this is largely      due to deconditioning.  3. I also gave her dietary instructions to avoid high purine diet.  She can follow up with Dr. Margo Common regarding further management of      her gout.     Learta Codding, MD,FACC  Electronically Signed    GED/MedQ  DD: 04/09/2009  DT: 04/10/2009  Job #: [id number]   cc:   Wyvonnia Lora

## 2011-05-11 NOTE — Cardiovascular Report (Signed)
[facility]                   [place] [facility]LECTROP[facility] DEVICE CLINIC NOTE   NAME:[name] [name]                       MRN:          [id number]  DATE:07/14/2007                            DOB:          1927/09/26    [name] [name] comes in.  She has a history of syncope and is status post  pacemaker implantation undertaken by Dr. Edwyna Shell in April.  Prior to that  transfer, she was evaluated for shortness of breath with an  echocardiogram that was normal and a Myoview scan that was normal.  Her  shortness of breath persists.   MEDICATIONS:  Glucophage, triamterine and hydrochlorothiazide,  potassium, metformin, and Vytorin.   PHYSICAL EXAMINATION:  VITAL SIGNS:  Her blood pressure is 148/85.  Her  pulse is 84.  LUNGS:  Clear.  HEART SOUNDS:  Regular.  EXTREMITIES:  No edema.  There is a significantly red right second toe  following trauma the other day.   Interrogation of her St. Jude self-propulsion generator demonstrates a P  wave of 1.7 with impedance of 358, a threshold of 0.75 and 0.4.  The R  wave was 2.7 with an impedance of 555, a threshold of 0.75 and 0.4.  Battery voltage is 2.78.  The device was reprogrammed to actually  autocatch when ventricular sensitivity was decreased to assure adequate  sensing safety margin.   IMPRESSION:  1. Trifascicular block with syncope.  2. Status post pacer for the above.  3. Dyspnea on exertion, probably diastolic failure.  4. Nonischemic Myoview and normal left ventricular function by      echocardiogram.  5. Diabetes.  6. Trauma to her right foot with __________ erythema.   [name] [name] is stable from an arrhythmia point of view.  Her dyspnea is  probably related to her obesity and potentially some diastolic component  of heart failure from her diabetic and hypertensive myopathy.   I have also told her to be quite concerned about this toe and if there  is any further redness or extension of the redness,  that she should call  Dr. Margo Common, as she might be at risk for developing a cellulitis.     Duke Salvia, MD, [facility]  Electronically Signed    SCK/MedQ  DD: 07/14/2007  DT: 07/15/2007  Job #: [id number]   cc:   Wyvonnia Lora

## 2011-05-14 NOTE — Discharge Summary (Signed)
[name] [name], [name] [name]                ACCOUNT NO.:  [id number]   MEDICAL RECORD NO.:  [id number]          PATIENT TYPE:  INP   LOCATION:  2023                         FACILITY:  [facility]   PHYSICIAN:  Bruce R. Juanda Chance, MD, FACCDATE OF BIRTH:  Dec 11, 1927   DATE OF ADMISSION:  04/18/2007  DATE OF DISCHARGE:  04/20/2007                               DISCHARGE SUMMARY   PROCEDURE:  Insertion of a dual lead Zephyr St. Jude pacemaker.   PRIMARY DIAGNOSES:  1. Syncope.  2. Bradycardia.   SECONDARY DIAGNOSES:  1. Diabetes.  2. Hypertension.  3. Edema.  4. Hyperlipidemia.  5. Obesity, metabolic syndrome.  6. Remote history of ovarian cyst removal, breast biopsy,      hemorrhoidectomy and hysterectomy.  7. History of osteoarthritis and gout.   TIME AT DISCHARGE:  Thirty-eight minutes.   HOSPITAL COURSE:  Ms. [name] [name] is a 77 year old female, with no previous  history of coronary disease or cardiac issues.  On the day of admission,  she had woke up on the floor and was bleeding from the bridge of her  nose.  She was transported to [facility] and in the emergency  room, she was admitted for further evaluation and treatment.  She was  noted to have some bradycardia and a tri vesicular block.  She was seen  by Cardiology and transfer was arranged to [facility] for  permanent pacemaker insertion.   Ms. [name] [name] had a permanent pacemaker inserted, St. Jude device, Montegut  model, dual lead, on April 19, 2007.  She tolerated the procedure well.   On April 20, 2007, her chest x-ray showed mild atelectasis, but no acute  disease and no pneumothorax.  She briefly ran a fever of 100.1, but had  no other symptoms and this resolved.  She was evaluated by Dr. Juanda Chance  and considered stable for discharge with outpatient follow up arranged.   DISCHARGE INSTRUCTIONS:  1. Her activity level is to be increased per the pacemaker discharge      instruction sheet.  She is to keep the incision clean  and dry for 7      days and is not to lift anything.  2. She is to follow up with Tereso Newcomer for Dr. Andee Lineman, on May 03, 2007 at 10:45 and with Dr. Margo Common as needed.  She is to see Dr.      Graciela Husbands for a pacer check on May 19, 2007 at 3:45.  3. She is to stick to a low-sodium, heart-healthy diet.   DISCHARGE MEDICATIONS:  1. Glucophage 500 mg b.i.d.  2. Potassium b.i.d.  3. Hydrochlorothiazide daily.  4. Glipizide 10 mg p.r.n.  5. Glucophage 500 mg b.i.d.      Theodore Demark, PA-C      Bruce R. Juanda Chance, MD, [facility]  Electronically Signed    RB/MEDQ  D:  04/20/2007  T:  04/20/2007  Job:  [id number]   cc:   Wyvonnia Lora  [facility]

## 2011-05-14 NOTE — Assessment & Plan Note (Signed)
[facility]                          [place] [facility]ARDIOLOGY OFFICE NOTE   NAME:[name], [name] KOORS                       MRN:          [id number]  DATE:05/03/2007                            DOB:          06-27-1927    CARDIOLOGIST:  She is new to Dr. Andee [name].   ELECTROPHYSIOLOGIST:  [name] Salvia, MD, F.A.C.C.   PRIMARY CARE PHYSICIAN:  Dr. Wyvonnia [name].   HISTORY OF PRESENT ILLNESS:  [name] [name] is a 77 year old female patient,  who was referred from [facility] to [facility] by Dr.  Andee [name] with a history of syncope in the setting of trifascicular block.  The patient has a Myoview scan done prior to transfer that was  essentially negative for ischemia.  An echocardiogram revealed normal LV  function with EF of 60-65%.  The patient underwent permanent pacemaker  implantation with a Zephyr XL DDDR [facility] pacemaker on April 19, 2007.  The patient's postoperative chest x-ray showed no pneumothorax.  She was discharged to home on April 20, 2007.  Unfortunately, instead of  being set up in the [place] office for a wound check with our EP  nurses, she was set up in the [place] office.  She denies any fevers.  She  has had some chills that have been mild.  She denies any cough.  Denies  any chest pain or shortness of breath.  Denies any further syncope.  She  does have a history of vertigo and notes occasional dizziness.  She  denies any dizziness like what she had prior to her pacemaker  implantation.   CURRENT MEDICATIONS INCLUDE:  1. Glucophage 500 mg twice a day.  2. Potassium 20 mEq two tablets b.i.d.  3. Vytorin 10/20 mg daily.  4. Glipizide 10 mg p.r.n.  5. Dyazide 25/37.5 mg a day.  6. Aspirin 81 mg daily.  7. Multivitamin.  8. Calcium plus vitamin D.  9. Darvocet p.r.n.   ALLERGIES:  No known drug allergies.   PHYSICAL EXAM:  She is a well-nourished, well-developed female, in no  acute distress.  Blood pressure is 136/86, pulse  93, weight 204.4  pounds.  HEENT:  Unremarkable.  NECK:  Without JVD.  CARDIAC:  Normal S1, S2, regular rate and rhythm without murmurs.  LUNGS:  Clear to auscultation bilaterally without wheezing, rhonchi or  rales.  ABDOMEN:  Soft, nontender.  EXTREMITIES:  Without edema.  Pacemaker site wound is without erythema or discharge.  The Steri-Strips  were removed.  The site is healing well.  There is no hematoma noted.  There is no left upper extremity edema noted.   Electrocardiogram reveals sinus rhythm with trifascicular block,  ventricular rate of 88.   IMPRESSION:  1. Syncope in the setting of trifascicular block.  Status post      permanent pacemaker implantation with a Saint Jude device, as noted      above.  2. Diabetes mellitus.  3. Hypertension.  4. Hyperlipidemia.  5. Obesity.  6. Metabolic syndrome.  7. History of osteoarthritis and gout.  8. Recent nonischemic Cardiolite study, April 2008.  9. Good LV function.   PLAN:  As noted above, the patient was set up in the [place] office,  instead of the [place] office, for followup.  We do not have the  capability of interrogating her pacemaker at this time.  She was,  however, also set up with Dr. Graciela [name] in the [facility] office on May 23.  She  should keep that appointment and we will be able to interrogate her  pacemaker at that time.  Her site seems to be healing well and she is  having no problems.  She will continue on her current medications and  follow up with Dr. Graciela [name], as noted above.      Tereso Newcomer, PA-C       Learta Codding, MD,FACC    SW/MedQ  DD: 05/03/2007  DT: 05/03/2007  Job #: [id number]   cc:   [name] [name]

## 2011-05-14 NOTE — Cardiovascular Report (Signed)
[name] [name], [name] [name]                ACCOUNT NO.:  [id number]   MEDICAL RECORD NO.:  [id number]          PATIENT TYPE:  INP   LOCATION:  2023                         FACILITY:  [facility]   PHYSICIAN:  Bruce R. Juanda Chance, MD, FACCDATE OF BIRTH:  1927-02-23   DATE OF PROCEDURE:  04/19/2007  DATE OF DISCHARGE:                            CARDIAC CATHETERIZATION   CLINICAL HISTORY:  Ms. [name] is 77 years old and was recently admitted  to [facility] with a syncopal episode.  She was found to have  trifascicular block.  She was worked up and found to have no other cause  for syncope and referred to [place] for implantation of a permanent  pacemaker.   PROCEDURE:  Implantation of a Zephyr XLDR DDDR St. Jude pacemaker (model  number [id number], serial number [id number]) with a Tendril bipolar ventricular  lead (model number 1788TC, 52 cm, serial number [id number]) and a St.  Jude OptiSense bipolar screw-in atrial lead (model number 1699TC/46 cm,  serial number [id number]).   ANESTHESIA:  One percent local Xylocaine.   ESTIMATED BLOOD LOSS:  Less than 20 mL.   COMPLICATIONS:  None.   PROCEDURE:  The procedure was performed in the EP room.  The left  anterior chest was prepped and draped in the usual fashion.  The skin  and subcutaneous tissue were anesthetized with 1% local Xylocaine.  Using an 18-gauge needle, the subclavian vein was entered and access was  secured with a 0.038 wire.  An incision was made below the clavicle and  extended to the prepectoral fascia.  A pocket was made inferior to the  incision using blunt dissection.  Using two 9 French sheaths, the atrial  and ventricular leads were positioned in the right atrium.  A 0.038 wire  was left in the subclavian vein for later access.  We placed a figure of  8  suture at the chest entry site before removing the sheath.  The  ventricular lead was positioned in the right ventricular apex and  screwed in with good pacing parameters described below.   The atrial lead  was positioned in the right atrial appendage and screwed in with good  pacing parameters as described below.  After removal of the stylet and  the 0.038 wire, the figure-of-eight suture was secured at the entry  site.  The leads were attached to the posterior aspect of the pocket  using two sutures of 1-0 silk around each Silastic collar.  The pocket  was irrigated with sterile Kanamycin solution.  The leads were attached  to the generator and the generator was implanted into the pocket and  secured loosely to the prepectoral fascia with 1-0 silk.  The  subcutaneous tissue was closed with running 2-0 Vicryl.  The skin was  closed with running 4-0 Vicryl.   PACING PARAMETERS:  Atrial lead P-wave 1.7 mV.  Minimum threshold  capture of 1 volt at a pulse width of 0.4.  Impedance 334 ohms.   VENTRICULAR LEAD:  R wave 12.0 mV.  Minimum threshold capture 0.8 volts  at a pulse width of 0.5.  Impedance 822 ohms.   The patient tolerated procedure well and left the laboratory in  satisfactory condition.      Bruce Elvera Lennox Juanda Chance, MD, [facility]  Electronically Signed     BRB/MEDQ  D:  04/19/2007  T:  04/19/2007  Job:  [id number]   cc:   Learta Codding, MD,FACC  Jonita Albee, [place] Wende Crease MD

## 2011-08-03 ENCOUNTER — Other Ambulatory Visit: Payer: Self-pay | Admitting: Cardiology

## 2011-08-04 NOTE — Telephone Encounter (Signed)
.   Requested Prescriptions   Pending Prescriptions Disp Refills  . verapamil (VERELAN PM) 120 MG 24 hr capsule [Pharmacy Med Name: VERAPAMIL ER 120MG  CAP] 60 capsule 4    Sig: TAKE ONE CAPSULE BY MOUTH TWICE DAILY  . hydrochlorothiazide 25 MG tablet [Pharmacy Med Name: HYDROCHLOROTHIAZIDE 25MG  TAB] 30 tablet 4    Sig: TAKE ONE TABLET BY MOUTH EVERY DAY

## 2011-08-27 ENCOUNTER — Encounter: Payer: Self-pay | Admitting: *Deleted

## 2011-09-23 ENCOUNTER — Ambulatory Visit (INDEPENDENT_AMBULATORY_CARE_PROVIDER_SITE_OTHER): Payer: Medicare Other | Admitting: *Deleted

## 2011-09-23 ENCOUNTER — Encounter: Payer: Self-pay | Admitting: Internal Medicine

## 2011-09-23 DIAGNOSIS — I453 Trifascicular block: Secondary | ICD-10-CM

## 2011-09-23 LAB — PACEMAKER DEVICE OBSERVATION
AL AMPLITUDE: 1.5 mv
BAMS-0003: 70 {beats}/min
BATTERY VOLTAGE: 2.76 V
RV LEAD AMPLITUDE: 7 mv

## 2011-09-23 NOTE — Progress Notes (Signed)
Pacer check in clinic  

## 2011-12-08 ENCOUNTER — Encounter: Payer: Self-pay | Admitting: Internal Medicine

## 2011-12-08 DIAGNOSIS — I453 Trifascicular block: Secondary | ICD-10-CM

## 2012-03-09 ENCOUNTER — Other Ambulatory Visit: Payer: Self-pay | Admitting: Cardiology

## 2012-03-14 ENCOUNTER — Encounter: Payer: Self-pay | Admitting: Internal Medicine

## 2012-03-14 DIAGNOSIS — I453 Trifascicular block: Secondary | ICD-10-CM

## 2012-04-12 ENCOUNTER — Other Ambulatory Visit: Payer: Self-pay | Admitting: Cardiology

## 2012-04-29 ENCOUNTER — Other Ambulatory Visit: Payer: Self-pay | Admitting: Cardiology

## 2012-05-29 ENCOUNTER — Other Ambulatory Visit: Payer: Self-pay | Admitting: Cardiology

## 2012-05-29 ENCOUNTER — Other Ambulatory Visit: Payer: Self-pay | Admitting: Neurology

## 2012-06-01 ENCOUNTER — Other Ambulatory Visit: Payer: Self-pay | Admitting: Neurology

## 2012-06-01 DIAGNOSIS — I10 Essential (primary) hypertension: Secondary | ICD-10-CM

## 2012-06-01 DIAGNOSIS — Z950 Presence of cardiac pacemaker: Secondary | ICD-10-CM

## 2012-06-01 DIAGNOSIS — E785 Hyperlipidemia, unspecified: Secondary | ICD-10-CM

## 2012-06-01 DIAGNOSIS — R413 Other amnesia: Secondary | ICD-10-CM

## 2012-06-02 ENCOUNTER — Ambulatory Visit
Admission: RE | Admit: 2012-06-02 | Discharge: 2012-06-02 | Disposition: A | Discharge status: Home / Self Care | Payer: Medicare Other | Source: Ambulatory Visit | Attending: Neurology | Admitting: Neurology

## 2012-06-02 DIAGNOSIS — I10 Essential (primary) hypertension: Secondary | ICD-10-CM

## 2012-06-02 DIAGNOSIS — R413 Other amnesia: Secondary | ICD-10-CM

## 2012-06-02 DIAGNOSIS — E785 Hyperlipidemia, unspecified: Secondary | ICD-10-CM

## 2012-06-02 DIAGNOSIS — Z950 Presence of cardiac pacemaker: Secondary | ICD-10-CM

## 2012-06-20 DIAGNOSIS — I453 Trifascicular block: Secondary | ICD-10-CM

## 2012-06-22 ENCOUNTER — Telehealth: Payer: Self-pay | Admitting: Internal Medicine

## 2012-06-22 NOTE — Telephone Encounter (Signed)
06-22-12 pt had remote ck 06-20-12 will send letter re results then let her know next appt/mt

## 2012-06-30 ENCOUNTER — Ambulatory Visit: Payer: Medicare Other | Admitting: Cardiology

## 2012-07-21 ENCOUNTER — Encounter: Payer: Self-pay | Admitting: *Deleted

## 2012-07-21 DIAGNOSIS — M1990 Unspecified osteoarthritis, unspecified site: Secondary | ICD-10-CM | POA: Insufficient documentation

## 2012-07-21 DIAGNOSIS — I340 Nonrheumatic mitral (valve) insufficiency: Secondary | ICD-10-CM | POA: Insufficient documentation

## 2012-07-21 DIAGNOSIS — I5189 Other ill-defined heart diseases: Secondary | ICD-10-CM | POA: Insufficient documentation

## 2012-07-21 DIAGNOSIS — I272 Pulmonary hypertension, unspecified: Secondary | ICD-10-CM | POA: Insufficient documentation

## 2012-07-21 DIAGNOSIS — E785 Hyperlipidemia, unspecified: Secondary | ICD-10-CM | POA: Insufficient documentation

## 2012-07-21 DIAGNOSIS — E669 Obesity, unspecified: Secondary | ICD-10-CM | POA: Insufficient documentation

## 2012-07-21 DIAGNOSIS — M109 Gout, unspecified: Secondary | ICD-10-CM | POA: Insufficient documentation

## 2012-07-21 DIAGNOSIS — R0600 Dyspnea, unspecified: Secondary | ICD-10-CM | POA: Insufficient documentation

## 2012-07-21 DIAGNOSIS — E8881 Metabolic syndrome: Secondary | ICD-10-CM | POA: Insufficient documentation

## 2012-08-14 ENCOUNTER — Ambulatory Visit (INDEPENDENT_AMBULATORY_CARE_PROVIDER_SITE_OTHER): Payer: Medicare Other | Admitting: Cardiology

## 2012-08-14 ENCOUNTER — Encounter: Payer: Self-pay | Admitting: Cardiology

## 2012-08-14 VITALS — BP 146/86 | HR 88 | Ht 60.0 in | Wt 198.0 lb

## 2012-08-14 DIAGNOSIS — I272 Pulmonary hypertension, unspecified: Secondary | ICD-10-CM

## 2012-08-14 DIAGNOSIS — I2789 Other specified pulmonary heart diseases: Secondary | ICD-10-CM

## 2012-08-14 DIAGNOSIS — I059 Rheumatic mitral valve disease, unspecified: Secondary | ICD-10-CM

## 2012-08-14 DIAGNOSIS — R0602 Shortness of breath: Secondary | ICD-10-CM

## 2012-08-14 DIAGNOSIS — Z950 Presence of cardiac pacemaker: Secondary | ICD-10-CM

## 2012-08-14 DIAGNOSIS — E785 Hyperlipidemia, unspecified: Secondary | ICD-10-CM

## 2012-08-14 DIAGNOSIS — F410 Panic disorder [episodic paroxysmal anxiety] without agoraphobia: Secondary | ICD-10-CM

## 2012-08-14 DIAGNOSIS — I340 Nonrheumatic mitral (valve) insufficiency: Secondary | ICD-10-CM

## 2012-08-14 DIAGNOSIS — I5032 Chronic diastolic (congestive) heart failure: Secondary | ICD-10-CM

## 2012-08-14 MED ORDER — ALBUTEROL SULFATE HFA 108 (90 BASE) MCG/ACT IN AERS: 2.0000 | INHALATION_SPRAY | Freq: Four times a day (QID) | RESPIRATORY_TRACT | Status: DC | PRN

## 2012-08-14 NOTE — Assessment & Plan Note (Signed)
Moderate pulmonary hypertension with no evidence of hypoxemia. No chronic oxygen therapy needed at the present. Pulmonary hypertension is most likely secondary to pulmonary venous hypertension due to diastolic dysfunction. Continue Dyazide diuretic and when necessary Lasix

## 2012-08-14 NOTE — Patient Instructions (Signed)
   Ventolin HFA - refills sent to pharmacy, future refills should come from primary MD   Device check - next available Your physician wants you to follow up in: 6 months.  You will receive a reminder letter in the mail one-two months in advance.  If you don't receive a letter, please call our office to schedule the follow up appointment

## 2012-08-14 NOTE — Assessment & Plan Note (Signed)
Managed by Dr. Margo Common. Anxiety usually during the middle of the night associated with insomnia but improved under medical therapy currently

## 2012-08-14 NOTE — Assessment & Plan Note (Signed)
Patient followed by the EP clinic.

## 2012-08-14 NOTE — Assessment & Plan Note (Signed)
Followed by the patient's primary care physician 

## 2012-08-14 NOTE — Assessment & Plan Note (Signed)
Patient still takes when necessary Lasix for lower extremity swelling. However she doesn't do this very often anymore. Clinically there is no evidence of volume overload. She reports no shortness of breath at rest and because of a bad knee the patient doesn't ambulate to often anymore. No indication to obtain an echocardiogram at the present time last study was obtained in September of 2011. Blood pressures also controlled

## 2012-08-14 NOTE — Assessment & Plan Note (Signed)
No evidence of significant mitral regurgitation by exam.

## 2012-08-14 NOTE — Progress Notes (Signed)
[name] Bottoms, MD, [facility]IM Board Certified in Adult Cardiovascular Medicine,Internal Medicine and Critical Care Medicine    CC:   Followup patient with diastolic dysfunction                                                                               HPI:        The patient is a pleasant 78 year old female with a history of pacemaker implantation for symptomatic complete heart block, she has a history of diastolic dysfunction and is used when necessary diuretic therapy in the past. She now has difficulty with her left knee and has difficulty ambulating and doing her ADLs. Her son however has provided her with an extra bedroom is helping her out with her daily house chores. She has been however were tremendous amount of stress because of her daughter and granddaughter with movement into the house and upset her daily routine. She has woken up multiple times in the middle of the night with panic attacks associated with dyspnea and palpitations. On a few occasions she has taken Xanax from her son and this immediately resolved her symptoms. From a cardiac standpoint she reports no concerning symptoms. She has no orthopnea PND, presyncope or syncope. She has no chest pain on minimal exertion although again she is limited in her exercise tolerance.    PMH: reviewed and listed in Problem List in Electronic Records (and see below) Past Medical History  Diagnosis Date  . Diastolic dysfunction   . Hypertension   . Diabetes mellitus   . Dyslipidemia   . Metabolic syndrome   . Dyspnea     chronic  . Obesity   . Osteoarthritis   . Gout   . Mitral regurgitation 2011    mild by echo  . Pulmonary hypertension     moderate   Past Surgical History  Procedure Date  . Insert / replace / remove pacemaker     syncope with trifascicular block,St. Jude dual chamber  . Ovarian cyst removal   . Breast biopsy   . Hemorrhoid surgery   . Other surgical history     hysterectomy    Allergies/SH/FHX :  available in Electronic Records for review  No Known Allergies History   Social History  . Marital Status: Widowed    Spouse Name: N/A    Number of Children: N/A  . Years of Education: N/A   Occupational History  . Not on file.   Social History Main Topics  . Smoking status: Never Smoker   . Smokeless tobacco: Never Used  . Alcohol Use: No  . Drug Use: No  . Sexually Active: Not on file   Other Topics Concern  . Not on file   Social History Narrative  . No narrative on file   No family history on file.  Medications: Current Outpatient Prescriptions  Medication Sig Dispense Refill  . acetaminophen (TYLENOL) 500 MG tablet Take 500 mg by mouth every 6 (six) hours as needed.        Marland Kitchen aspirin 81 MG tablet Take 81 mg by mouth daily.        . Calcium Carbonate-Vitamin D (CALCIUM 600 + D PO) Take  1 tablet by mouth daily.       . furosemide (LASIX) 20 MG tablet Take 20 mg by mouth daily as needed.       Marland Kitchen glipiZIDE (GLUCOTROL) 5 MG tablet Take 5 mg by mouth daily.        Marland Kitchen ibuprofen (ADVIL,MOTRIN) 200 MG tablet Take 200 mg by mouth every 6 (six) hours as needed.      Marland Kitchen lisinopril (PRINIVIL,ZESTRIL) 20 MG tablet TAKE ONE TABLET BY MOUTH EVERY DAY  7 tablet  0  . metFORMIN (GLUCOPHAGE) 500 MG tablet Take 500 mg by mouth 2 (two) times daily with a meal.      . naproxen (NAPROSYN) 500 MG tablet Take 500 mg by mouth 2 (two) times daily as needed.       . potassium chloride (KLOR-CON) 8 MEQ CR tablet Take 8 mEq by mouth daily.        . pravastatin (PRAVACHOL) 40 MG tablet Take 40 mg by mouth daily.        . hydrochlorothiazide (HYDRODIURIL) 25 MG tablet TAKE ONE TABLET BY MOUTH EVERY DAY  30 tablet  1    ROS: No nausea or vomiting. No fever or chills.No melena or hematochezia.No bleeding.No claudication  Physical Exam: BP 146/86  Pulse 88  Ht 5' (1.524 m)  Wt 198 lb (89.812 kg)  BMI 38.67 kg/m2  SpO2 93% General: Well-nourished white female sitting in wheelchair Neck: Normal  carotid upstroke no carotid bruits no thyromegaly nonnodular thyroid. JVP is approximately 6-7 cm Lungs: Clear breath sounds bilaterally without wheezing Cardiac: Regular rate and rhythm with a paradoxically split second heart sound. Vascular: Trace edema. No cyanosis or clubbing. Normal distal pulses Skin: Warm and dry Physcologic: Normal affect  12lead ECG: Ventricular pacing, no P-wave activity possible underlying atrial fibrillation Limited bedside ECHO:N/A No images are attached to the encounter.   I reviewed and summarized the old records. I reviewed ECG and prior blood work.  Assessment and Plan  Cardiac pacemaker in situ Patient followed by the EP clinic.  Chronic diastolic heart failure Patient still takes when necessary Lasix for lower extremity swelling. However she doesn't do this very often anymore. Clinically there is no evidence of volume overload. She reports no shortness of breath at rest and because of a bad knee the patient doesn't ambulate to often anymore. No indication to obtain an echocardiogram at the present time last study was obtained in September of 2011. Blood pressures also controlled  Panic attacks Managed by Dr. Margo Common. Anxiety usually during the middle of the night associated with insomnia but improved under medical therapy currently  Pulmonary hypertension Moderate pulmonary hypertension with no evidence of hypoxemia. No chronic oxygen therapy needed at the present. Pulmonary hypertension is most likely secondary to pulmonary venous hypertension due to diastolic dysfunction. Continue Dyazide diuretic and when necessary Lasix  Dyslipidemia Followed by the patient's primary care physician  Mitral regurgitation No evidence of significant mitral regurgitation by exam.  Atrial fibrillation Review of the electrocardiogram demonstrates that the patient has appropriate ventricular pacing but do not see any P waves activity. She also did not have a  pacemaker interrogation for 2 years we will have this evaluated and indeed the patient is underlying atrial fibrillation she will need anticoagulant therapy because she is at high risk for stroke.    Patient Active Problem List  Diagnosis  . DM  . ESSENTIAL HYPERTENSION, BENIGN  . Chronic diastolic heart failure  . Cardiac pacemaker in situ  .  Dyslipidemia  . Metabolic syndrome  . Dyspnea  . Obesity  . Osteoarthritis  . Gout  . Mitral regurgitation  . Pulmonary hypertension  . Panic attacks

## 2012-08-29 ENCOUNTER — Encounter: Payer: Self-pay | Admitting: *Deleted

## 2012-10-16 ENCOUNTER — Telehealth: Payer: Self-pay | Admitting: Internal Medicine

## 2012-10-16 NOTE — Telephone Encounter (Signed)
10-16-12 talked to pt's son re rs pt's cxl pacer ck in [place], pt in rehab at [facility] nursing center, will call back when she get's out/mt

## 2012-11-09 ENCOUNTER — Ambulatory Visit (INDEPENDENT_AMBULATORY_CARE_PROVIDER_SITE_OTHER): Payer: Medicare Other | Admitting: *Deleted

## 2012-11-09 DIAGNOSIS — Z950 Presence of cardiac pacemaker: Secondary | ICD-10-CM

## 2012-11-09 DIAGNOSIS — I495 Sick sinus syndrome: Secondary | ICD-10-CM

## 2012-11-09 LAB — PACEMAKER DEVICE OBSERVATION
AL THRESHOLD: 0.625 V
BAMS-0001: 180 {beats}/min
BAMS-0003: 70 {beats}/min
BATTERY VOLTAGE: 2.76 V
DEVICE MODEL PM: 1898220
RV LEAD AMPLITUDE: 8.9 mv

## 2012-11-09 NOTE — Progress Notes (Signed)
Pacer check in clinic  

## 2012-11-28 ENCOUNTER — Encounter: Payer: Self-pay | Admitting: Internal Medicine

## 2012-12-19 ENCOUNTER — Encounter: Payer: Self-pay | Admitting: Cardiology

## 2012-12-22 ENCOUNTER — Ambulatory Visit (INDEPENDENT_AMBULATORY_CARE_PROVIDER_SITE_OTHER): Payer: Medicare Other | Admitting: Cardiology

## 2012-12-22 ENCOUNTER — Encounter: Payer: Self-pay | Admitting: Cardiology

## 2012-12-22 VITALS — BP 157/74 | HR 55 | Ht 60.0 in | Wt 194.8 lb

## 2012-12-22 DIAGNOSIS — I10 Essential (primary) hypertension: Secondary | ICD-10-CM

## 2012-12-22 DIAGNOSIS — I059 Rheumatic mitral valve disease, unspecified: Secondary | ICD-10-CM

## 2012-12-22 DIAGNOSIS — I5032 Chronic diastolic (congestive) heart failure: Secondary | ICD-10-CM

## 2012-12-22 DIAGNOSIS — Z950 Presence of cardiac pacemaker: Secondary | ICD-10-CM

## 2012-12-22 DIAGNOSIS — I340 Nonrheumatic mitral (valve) insufficiency: Secondary | ICD-10-CM

## 2012-12-22 NOTE — Assessment & Plan Note (Signed)
Keep followup with Dr. Allred. 

## 2012-12-22 NOTE — Assessment & Plan Note (Signed)
Mild by most recent echocardiogram. 

## 2012-12-22 NOTE — Assessment & Plan Note (Signed)
Symptomatically stable, weight is down 4 pounds from the last visit. She has not been using Lasix with any regularity. Medical regimen otherwise stable.

## 2012-12-22 NOTE — Patient Instructions (Signed)
Your physician recommends that you schedule a follow-up appointment in: 6 month with Dr. Diona Browner

## 2012-12-22 NOTE — Assessment & Plan Note (Signed)
Blood pressure is elevated today. Patient reports compliance with her medications. Keep followup with Dr. Margo Common.

## 2012-12-22 NOTE — Progress Notes (Signed)
Clinical Summary [name] [name] is an 78 y.o.female presenting for followup. She is a former patient of Dr. Andee Lineman, last seen in August. She is here with her son today. Now status post left knee replacement back in November, living with her son, having completed physical therapy. She is using a cane to walk, has been trying to gradually increase her activity. States that she had a bad day today related to low blood sugar in the morning. Otherwise has not had any progressive shortness of breath.  Echocardiogram from 2011 revealed mild LVH with LVEF 55-60%, diastolic dysfunction, mild mitral regurgitation, pacemaker wire in right heart, mild tricuspid regurgitation, RVSP 48 mm mercury.  Weight is down 4 pounds from the last visit. Had pacer check in November.   No Known Allergies  Current Outpatient Prescriptions  Medication Sig Dispense Refill  . acetaminophen (TYLENOL) 500 MG tablet Take 500 mg by mouth every 6 (six) hours as needed.        Marland Kitchen albuterol (PROVENTIL HFA;VENTOLIN HFA) 108 (90 BASE) MCG/ACT inhaler Inhale 2 puffs into the lungs every 6 (six) hours as needed for wheezing.  1 Inhaler  2  . aspirin 81 MG tablet Take 81 mg by mouth daily.        . Calcium Carbonate-Vitamin D (CALCIUM 600 + D PO) Take 1 tablet by mouth daily.       . Cholecalciferol (VITAMIN D-3) 1000 UNITS CAPS Take 1,000 Units by mouth daily.      . clonazePAM (KLONOPIN) 0.5 MG tablet Take 0.5 mg by mouth at bedtime. May take 1/2 tablet if needed during day.      . furosemide (LASIX) 20 MG tablet Take 20 mg by mouth daily as needed.       Marland Kitchen glipiZIDE (GLUCOTROL) 5 MG tablet Take 5 mg by mouth daily.        Marland Kitchen ibuprofen (ADVIL,MOTRIN) 200 MG tablet Take 200 mg by mouth every 6 (six) hours as needed.      Marland Kitchen lisinopril (PRINIVIL,ZESTRIL) 20 MG tablet TAKE ONE TABLET BY MOUTH EVERY DAY  7 tablet  0  . metFORMIN (GLUCOPHAGE) 500 MG tablet Take 500 mg by mouth 2 (two) times daily with a meal.      . Multiple Vitamin  (MULTIVITAMIN) tablet Take 1 tablet by mouth daily.      . naproxen (NAPROSYN) 500 MG tablet Take 500 mg by mouth 2 (two) times daily as needed.       . potassium chloride (KLOR-CON) 8 MEQ CR tablet Take 8 mEq by mouth daily.        . pravastatin (PRAVACHOL) 40 MG tablet Take 40 mg by mouth daily.        . hydrochlorothiazide (HYDRODIURIL) 25 MG tablet TAKE ONE TABLET BY MOUTH EVERY DAY  30 tablet  1    Past Medical History  Diagnosis Date  . Chronic diastolic heart failure   . Essential hypertension, benign   . Type 2 diabetes mellitus   . Mixed hyperlipidemia   . Osteoarthritis   . Gout   . Pulmonary hypertension     Moderate - secondary to diastolic dysfunction  . Panic attacks   . Symptomatic advanced heart block     Status post pacemaker placement    Past Surgical History  Procedure Date  . Insert / replace / remove pacemaker     Syncope with trifascicular block, St. Jude dual chamber  . Ovarian cyst removal   . Breast biopsy   .  Hemorrhoid surgery   . Abdominal hysterectomy     Social History [name] [name] reports that she has never smoked. She has never used smokeless tobacco. [name] [name] reports that she does not drink alcohol.  Review of Systems No sense of palpitations. No dizziness or syncope. Appetite stable. Has not needed any extra Lasix doses.  Physical Examination Filed Vitals:   12/22/12 1441  BP: 157/74  Pulse: 55   Filed Weights   12/22/12 1441  Weight: 194 lb 12.8 oz (88.361 kg)   No acute distress. HEENT: Conjunctiva and lids normal, oropharynx clear. Neck: Supple, no elevated JVP or carotid bruits, no thyromegaly. Lungs: Clear to auscultation, nonlabored breathing at rest. Cardiac: Regular rate and rhythm with ectopic beats, no S3, soft systolic murmur, no pericardial rub. Abdomen: Soft, nontender, bowel sounds present. Extremities: 1+ edema, distal pulses 1-2+. Skin: Warm and dry. Musculoskeletal: No kyphosis. Neuropsychiatric: Alert and  oriented x3, affect grossly appropriate.   Problem List and Plan   Chronic diastolic heart failure Symptomatically stable, weight is down 4 pounds from the last visit. She has not been using Lasix with any regularity. Medical regimen otherwise stable.  ESSENTIAL HYPERTENSION, BENIGN Blood pressure is elevated today. Patient reports compliance with her medications. Keep followup with Dr. Margo Common.  Mitral regurgitation Mild by most recent echocardiogram.  PPM-St.Jude Keep followup with Dr. Johney Frame.    Jonelle Sidle, M.D., F.A.C.C.

## 2013-03-07 ENCOUNTER — Ambulatory Visit (INDEPENDENT_AMBULATORY_CARE_PROVIDER_SITE_OTHER): Payer: Medicare Other | Admitting: Internal Medicine

## 2013-03-07 ENCOUNTER — Encounter: Payer: Self-pay | Admitting: Internal Medicine

## 2013-03-07 VITALS — BP 134/77 | HR 79 | Ht 60.0 in | Wt 198.0 lb

## 2013-03-07 DIAGNOSIS — I441 Atrioventricular block, second degree: Secondary | ICD-10-CM

## 2013-03-07 DIAGNOSIS — I5032 Chronic diastolic (congestive) heart failure: Secondary | ICD-10-CM

## 2013-03-07 DIAGNOSIS — I10 Essential (primary) hypertension: Secondary | ICD-10-CM

## 2013-03-07 DIAGNOSIS — Z950 Presence of cardiac pacemaker: Secondary | ICD-10-CM

## 2013-03-07 LAB — PACEMAKER DEVICE OBSERVATION
AL AMPLITUDE: 2 mv
AL IMPEDENCE PM: 339 Ohm
AL THRESHOLD: 0.75 V
BAMS-0003: 70 {beats}/min
BATTERY VOLTAGE: 2.76 V
RV LEAD AMPLITUDE: 10.4 mv

## 2013-03-07 NOTE — Progress Notes (Signed)
PCP: Louie Boston, MD Primary Cardiologist:  Dr Thomasena Edis is a 79 y.o. female who presents today for routine electrophysiology followup.  Since last being seen in our clinic, the patient reports doing very well.  Today, she denies symptoms of palpitations, chest pain, shortness of breath,  lower extremity edema, dizziness, presyncope, or syncope.  The patient is otherwise without complaint today.   Past Medical History  Diagnosis Date  . Chronic diastolic heart failure   . Essential hypertension, benign   . Type 2 diabetes mellitus   . Mixed hyperlipidemia   . Osteoarthritis   . Gout   . Pulmonary hypertension     Moderate - secondary to diastolic dysfunction  . Panic attacks   . Symptomatic advanced heart block     Status post pacemaker placement   Past Surgical History  Procedure Laterality Date  . Pacemaker insertion  04/19/07    SJM Zephyr XL DR implanted by Dr Juanda Chance for syncope and mobitz II AV block  . Ovarian cyst removal    . Breast biopsy    . Hemorrhoid surgery    . Abdominal hysterectomy      Current Outpatient Prescriptions  Medication Sig Dispense Refill  . acetaminophen (TYLENOL) 500 MG tablet Take 500 mg by mouth every 6 (six) hours as needed.        Marland Kitchen albuterol (PROVENTIL HFA;VENTOLIN HFA) 108 (90 BASE) MCG/ACT inhaler Inhale 2 puffs into the lungs every 6 (six) hours as needed for wheezing.  1 Inhaler  2  . aspirin 81 MG tablet Take 81 mg by mouth daily.        . Calcium Carbonate-Vitamin D (CALCIUM 600 + D PO) Take 1 tablet by mouth daily.       . clonazePAM (KLONOPIN) 0.5 MG tablet Take 0.5 mg by mouth at bedtime. May take 1/2 tablet if needed during day.      . furosemide (LASIX) 20 MG tablet Take 20 mg by mouth daily as needed.       Marland Kitchen glipiZIDE (GLUCOTROL) 5 MG tablet Take 5 mg by mouth daily.        . hydrochlorothiazide (HYDRODIURIL) 25 MG tablet TAKE ONE TABLET BY MOUTH EVERY DAY  30 tablet  1  . lisinopril (PRINIVIL,ZESTRIL) 20 MG  tablet TAKE ONE TABLET BY MOUTH EVERY DAY  7 tablet  0  . metFORMIN (GLUCOPHAGE) 500 MG tablet Take 500 mg by mouth 2 (two) times daily with a meal.      . Multiple Vitamin (MULTIVITAMIN) tablet Take 1 tablet by mouth daily.      . potassium chloride (KLOR-CON) 8 MEQ CR tablet Take 8 mEq by mouth daily.        . pravastatin (PRAVACHOL) 40 MG tablet Take 40 mg by mouth daily.        . verapamil (CALAN) 120 MG tablet Take 120 mg by mouth 2 (two) times daily.       No current facility-administered medications for this visit.    Physical Exam: Filed Vitals:   03/07/13 1015  BP: 134/77  Pulse: 79  Height: 5' (1.524 m)  Weight: 198 lb (89.812 kg)    GEN- The patient is well appearing, alert and oriented x 3 today.   Head- normocephalic, atraumatic Eyes-  Sclera clear, conjunctiva pink Ears- hearing intact Oropharynx- clear Lungs- Clear to ausculation bilaterally, normal work of breathing Chest- pacemaker pocket is well healed Heart- Regular rate and rhythm, no murmurs, rubs or gallops, PMI  not laterally displaced GI- soft, NT, ND, + BS Extremities- no clubbing, cyanosis, or edema  Pacemaker interrogation- reviewed in detail today,  See PACEART report  Assessment and Plan:  1. Syncope/ mobitz II AV block Normal pacemaker function See Pace Art report No changes today  2. HTN Stable No change required today  3. Diastolic dysfunction Stable No change required today   Return to the device clinic in 1year

## 2013-03-07 NOTE — Patient Instructions (Addendum)
   Allred - 1 year Continue all current medications.

## 2013-06-21 DIAGNOSIS — K922 Gastrointestinal hemorrhage, unspecified: Secondary | ICD-10-CM

## 2013-10-16 ENCOUNTER — Encounter: Payer: Self-pay | Admitting: Internal Medicine

## 2014-04-08 ENCOUNTER — Encounter: Payer: Self-pay | Admitting: Internal Medicine

## 2014-04-08 ENCOUNTER — Ambulatory Visit (INDEPENDENT_AMBULATORY_CARE_PROVIDER_SITE_OTHER): Payer: Medicare Other | Admitting: Internal Medicine

## 2014-04-08 VITALS — BP 99/63 | HR 52 | Ht 60.0 in | Wt 182.8 lb

## 2014-04-08 DIAGNOSIS — I5032 Chronic diastolic (congestive) heart failure: Secondary | ICD-10-CM

## 2014-04-08 DIAGNOSIS — I2789 Other specified pulmonary heart diseases: Secondary | ICD-10-CM

## 2014-04-08 DIAGNOSIS — I441 Atrioventricular block, second degree: Secondary | ICD-10-CM

## 2014-04-08 DIAGNOSIS — I272 Pulmonary hypertension, unspecified: Secondary | ICD-10-CM

## 2014-04-08 DIAGNOSIS — I10 Essential (primary) hypertension: Secondary | ICD-10-CM

## 2014-04-08 DIAGNOSIS — Z950 Presence of cardiac pacemaker: Secondary | ICD-10-CM

## 2014-04-08 LAB — MDC_IDC_ENUM_SESS_TYPE_INCLINIC
Battery Impedance: 1400 Ohm
Battery Voltage: 2.78 V
Implantable Pulse Generator Model: 5826
Implantable Pulse Generator Serial Number: 1898220
Lead Channel Impedance Value: 342 Ohm
Lead Channel Pacing Threshold Amplitude: 0.75 V
Lead Channel Pacing Threshold Pulse Width: 0.4 ms
Lead Channel Pacing Threshold Pulse Width: 0.4 ms
Lead Channel Setting Pacing Amplitude: 2 V
Lead Channel Setting Pacing Pulse Width: 0.4 ms
MDC IDC MSMT LEADCHNL RA PACING THRESHOLD AMPLITUDE: 0.75 V
MDC IDC MSMT LEADCHNL RA SENSING INTR AMPL: 1.8 mV
MDC IDC MSMT LEADCHNL RV IMPEDANCE VALUE: 331 Ohm
MDC IDC MSMT LEADCHNL RV SENSING INTR AMPL: 8.5 mV
MDC IDC SESS DTM: 20150413091737
MDC IDC SET LEADCHNL RV SENSING SENSITIVITY: 1.5 mV
MDC IDC STAT BRADY RA PERCENT PACED: 8.1 %
MDC IDC STAT BRADY RV PERCENT PACED: 86 %

## 2014-04-08 MED ORDER — CARVEDILOL 6.25 MG PO TABS: 6.2500 mg | ORAL_TABLET | Freq: Two times a day (BID) | ORAL | Status: DC

## 2014-04-08 NOTE — Progress Notes (Signed)
PCP: Louie BostonAPPER,DAVID B, MD Primary Cardiologist:  Dr Thomasena EdisMcDowell  [name] [name] is a [age] y.o. female who presents today for routine electrophysiology followup.  Since last being seen in our clinic, the patient reports having difficulty.  She [facility]  She was then in a nursing home over the next 6 months.  She says that she has just returned to her home.  She feels presently at her baseline but does have fatigue and weakness.  She has chronic SOB for which she is on O2 at home.  Today, she denies symptoms of palpitations, chest pain, lower extremity edema, dizziness, presyncope, or syncope.  The patient is otherwise without complaint today.  She says that her family helps with her medicines and she is not certain as to what medicines she is presently taking.  Past Medical History  Diagnosis Date  . Chronic diastolic heart failure   . Essential hypertension, benign   . Type 2 diabetes mellitus   . Mixed hyperlipidemia   . Osteoarthritis   . Gout   . Pulmonary hypertension     Moderate - secondary to diastolic dysfunction  . Panic attacks   . Symptomatic advanced heart block     Status post pacemaker placement   Past Surgical History  Procedure Laterality Date  . Pacemaker insertion  04/19/07    SJM Zephyr XL DR implanted by Dr Juanda ChanceBrodie for syncope and mobitz II AV block  . Ovarian cyst removal    . Breast biopsy    . Hemorrhoid surgery    . Abdominal hysterectomy      Current Outpatient Prescriptions  Medication Sig Dispense Refill  . acetaminophen (TYLENOL) 500 MG tablet Take 500 mg by mouth every 6 (six) hours as needed.        Marland Kitchen. aspirin 81 MG tablet Take 81 mg by mouth daily.        . Calcium Carbonate-Vitamin D (CALCIUM 600 + D PO) Take 1 tablet by mouth daily.       . carvedilol (COREG) 12.5 MG tablet Take 12.5 mg by mouth 2 (two) times daily with a meal.      . Cholecalciferol (VITAMIN D-3) 1000 UNITS CAPS Take 1 capsule by mouth daily.      .  clonazePAM (KLONOPIN) 1 MG tablet Take 1 mg by mouth at bedtime.      . donepezil (ARICEPT) 10 MG tablet Take 10 mg by mouth at bedtime.      . furosemide (LASIX) 20 MG tablet Take 20 mg by mouth 2 (two) times daily.       Marland Kitchen. lisinopril (PRINIVIL,ZESTRIL) 20 MG tablet TAKE ONE TABLET BY MOUTH EVERY DAY  7 tablet  0  . metFORMIN (GLUCOPHAGE) 500 MG tablet Take 500 mg by mouth 2 (two) times daily with a meal.      . Multiple Vitamin (MULTIVITAMIN) tablet Take 1 tablet by mouth daily.      . potassium chloride (KLOR-CON) 8 MEQ CR tablet Take 8 mEq by mouth 3 (three) times daily.       . pravastatin (PRAVACHOL) 40 MG tablet Take 40 mg by mouth daily.        Marland Kitchen. tiZANidine (ZANAFLEX) 4 MG capsule Take 4 mg by mouth 2 (two) times daily.      . vitamin C (ASCORBIC ACID) 500 MG tablet Take 500 mg by mouth daily.      Marland Kitchen. zinc gluconate 50 MG tablet Take 50 mg by mouth daily.      .Marland Kitchen  albuterol (PROVENTIL HFA;VENTOLIN HFA) 108 (90 BASE) MCG/ACT inhaler Inhale 2 puffs into the lungs every 6 (six) hours as needed for wheezing.  1 Inhaler  2   No current facility-administered medications for this visit.    Physical Exam: Filed Vitals:   04/08/14 0856  BP: 99/63  Pulse: 52  Height: 5' (1.524 m)  Weight: 182 lb 12.8 oz (82.918 kg)  SpO2: 95%    GEN- The patient is chronically ill and fatigued appearing, alert and oriented x 3 today.   Head- normocephalic, atraumatic Eyes-  Sclera clear, conjunctiva pink Ears- hearing intact Oropharynx- clear Lungs- Clear to ausculation bilaterally, normal work of breathing, wearing O2 today Chest- pacemaker pocket is well healed Heart- Regular rate and rhythm, no murmurs, rubs or gallops, PMI not laterally displaced GI- soft, NT, ND, + BS Extremities- no clubbing, cyanosis, or edema  Pacemaker interrogation- reviewed in detail today,  See PACEART report  Assessment and Plan:  1. Syncope/ mobitz II AV block Normal pacemaker function See Pace Art report No  changes today  2. HTN Stable No change required today  3. Diastolic dysfunction Stable She is hypotensive and fatigued.  I will obtain a bmet and cbc today to evaluate for other causes of her hypotension. Decrease coreg to 6.25mg  BID She needs close follow-up with Dr Diona BrownerMcDowell (Hasnt seen him in over a year)  Return to see Belenda CruiseKristin in 6 months I will see again in 1year

## 2014-04-08 NOTE — Addendum Note (Signed)
Addended by: Lesle ChrisHILL, Taziah Difatta G on: 04/08/2014 09:39 AM   Modules accepted: Orders

## 2014-04-08 NOTE — Patient Instructions (Signed)
   Decrease Coreg to 6.25mg  twice a day (may break your 12.5mg  tablet in half till finish current supply) - new sent to pharm Continue all other medications.   Labs for BMET, CBC Office will contact with results via phone or letter.    Next available with Dr. Diona BrownerMcDowell Your physician wants you to follow up in: 6 months.  You will receive a reminder letter in the mail one-two months in advance.  If you don't receive a letter, please call our office to schedule the follow up appointment - DEVICE Your physician wants you to follow up in:  1 year.  You will receive a reminder letter in the mail one-two months in advance.  If you don't receive a letter, please call our office to schedule the follow up appointment - DR. ALLRED

## 2014-04-18 ENCOUNTER — Ambulatory Visit (INDEPENDENT_AMBULATORY_CARE_PROVIDER_SITE_OTHER): Payer: Medicare Other | Admitting: Cardiology

## 2014-04-18 ENCOUNTER — Encounter: Payer: Self-pay | Admitting: Cardiology

## 2014-04-18 VITALS — BP 161/82 | HR 64 | Ht 60.0 in | Wt 182.0 lb

## 2014-04-18 DIAGNOSIS — I429 Cardiomyopathy, unspecified: Secondary | ICD-10-CM

## 2014-04-18 DIAGNOSIS — I340 Nonrheumatic mitral (valve) insufficiency: Secondary | ICD-10-CM

## 2014-04-18 DIAGNOSIS — I5032 Chronic diastolic (congestive) heart failure: Secondary | ICD-10-CM

## 2014-04-18 DIAGNOSIS — I10 Essential (primary) hypertension: Secondary | ICD-10-CM

## 2014-04-18 DIAGNOSIS — E785 Hyperlipidemia, unspecified: Secondary | ICD-10-CM

## 2014-04-18 DIAGNOSIS — I441 Atrioventricular block, second degree: Secondary | ICD-10-CM

## 2014-04-18 DIAGNOSIS — I059 Rheumatic mitral valve disease, unspecified: Secondary | ICD-10-CM

## 2014-04-18 NOTE — Assessment & Plan Note (Signed)
Mild as of 2011

## 2014-04-18 NOTE — Assessment & Plan Note (Signed)
Diagnosis based on previous testing. Sounds like she did have significant volume overload within the last 6 months, now more stable. Continue her current regimen, arrange a followup echocardiogram to reassess cardiac structure and function. Family is helping her with medications at home. Encouraged to continue to check weights daily, also follow blood pressure. We can make medication adjustments as needed. I will see her back within the next 4-6 weeks.

## 2014-04-18 NOTE — Assessment & Plan Note (Signed)
Coreg recently reduced by Dr. Johney FrameAllred. Need to continue and check blood pressure. Looks like she has been hypertensive more than hypotensive.

## 2014-04-18 NOTE — Assessment & Plan Note (Signed)
Continues on Pravachol, followed by Dr. Margo Commonapper.

## 2014-04-18 NOTE — Progress Notes (Signed)
Clinical Summary [name] [name] is an 80 y.o.female last seen in December 2013. She just had a recent visit with Dr. Johney [name], reportedly [name] and had a subsequent prolonged nursing home stay. Her daughter tells me that [name] [name] was hospitalized with fluid overload, also UTI, mental status changes with dementia, required prolonged rehabilitation. She was noted to be hypotensive at the recent visit and Dr. Johney [name] elected to decrease Coreg. Lab work was also obtained. Weight at the last visit was 182 pounds which was down 16 pounds from a year before.  Recent lab work from April 13 showed BUN 18, creatinine 2.6, sodium 139, potassium 3.6, hemoglobin 12.6, platelets 148.  Echocardiogram from 2011 revealed mild LVH with LVEF 55-60%, diastolic dysfunction, mild mitral regurgitation, pacemaker wire in right heart, mild tricuspid regurgitation, RVSP 48 mm mercury. No followup studies noted.  [name] [name] uses oxygen continuously now, a walker to ambulate. She denies falls. She gets assistance with her medications at home from the family. She seems to be doing reasonably well. I reviewed her home blood pressure and heart rate checks. Systolics actually been more consistently high than they have been low.   No Known Allergies  Current Outpatient Prescriptions  Medication Sig Dispense Refill  . acetaminophen (TYLENOL) 500 MG tablet Take 500 mg by mouth every 6 (six) hours as needed.        Marland Kitchen. aspirin 81 MG tablet Take 81 mg by mouth daily.        . carvedilol (COREG) 6.25 MG tablet Take 1 tablet (6.25 mg total) by mouth 2 (two) times daily with a meal.  60 tablet  6  . Cholecalciferol (VITAMIN D-3) 1000 UNITS CAPS Take 1 capsule by mouth daily.      . clonazePAM (KLONOPIN) 1 MG tablet Take 1 mg by mouth at bedtime.      . donepezil (ARICEPT) 10 MG tablet Take 10 mg by mouth at bedtime.      . furosemide (LASIX) 20 MG tablet Take 20 mg by mouth 2 (two) times daily.       Marland Kitchen.  lisinopril (PRINIVIL,ZESTRIL) 20 MG tablet TAKE ONE TABLET BY MOUTH EVERY DAY  7 tablet  0  . metFORMIN (GLUCOPHAGE) 500 MG tablet Take 500 mg by mouth 2 (two) times daily with a meal.      . Multiple Vitamin (MULTIVITAMIN) tablet Take 1 tablet by mouth daily.      . potassium chloride (KLOR-CON) 8 MEQ CR tablet Take 8 mEq by mouth 3 (three) times daily.       . pravastatin (PRAVACHOL) 40 MG tablet Take 40 mg by mouth daily.        Marland Kitchen. tiZANidine (ZANAFLEX) 4 MG capsule Take 4 mg by mouth 2 (two) times daily.       No current facility-administered medications for this visit.    Past Medical History  Diagnosis Date  . Chronic diastolic heart failure   . Essential hypertension, benign   . Type 2 diabetes mellitus   . Mixed hyperlipidemia   . Osteoarthritis   . Gout   . Pulmonary hypertension     Moderate - secondary to diastolic dysfunction  . Panic attacks   . Symptomatic advanced heart block     Status post pacemaker placement    Past Surgical History  Procedure Laterality Date  . Pacemaker insertion  04/19/07    SJM Zephyr XL DR implanted by Dr Juanda ChanceBrodie for syncope and mobitz II AV  block  . Ovarian cyst removal    . Breast biopsy    . Hemorrhoid surgery    . Abdominal hysterectomy      Social History [name] [name] reports that she has never smoked. She has never used smokeless tobacco. [name] [name] reports that she does not drink alcohol.  Review of Systems Negative except as outlined. No leg edema. States that she weighs herself daily.  Physical Examination Filed Vitals:   04/18/14 1415  BP: 161/82  Pulse: 64   Filed Weights   04/18/14 1415  Weight: 182 lb (82.555 kg)    Overweight woman, appears comfortable. Wearing oxygen. HEENT: Conjunctiva and lids normal, oropharynx clear.  Neck: Supple, no elevated JVP or carotid bruits, no thyromegaly.  Lungs: Clear to auscultation, nonlabored breathing at rest.  Cardiac: Regular rate and rhythm with ectopic beats, no S3, soft  systolic murmur, no pericardial rub.  Abdomen: Soft, nontender, bowel sounds present.  Extremities: No edema, distal pulses 1-2+.  Skin: Warm and dry.  Musculoskeletal: No kyphosis.  Neuropsychiatric: Alert and oriented x3, affect grossly appropriate.   Problem List and Plan   Chronic diastolic heart failure Diagnosis based on previous testing. Sounds like she did have significant volume overload within the last 6 months, now more stable. Continue her current regimen, arrange a followup echocardiogram to reassess cardiac structure and function. Family is helping her with medications at home. Encouraged to continue to check weights daily, also follow blood pressure. We can make medication adjustments as needed. I will see her back within the next 4-6 weeks.  Mitral regurgitation Mild as of 2011  Essential hypertension, benign Coreg recently reduced by Dr. Johney [name]. Need to continue and check blood pressure. Looks like she has been hypertensive more than hypotensive.  Dyslipidemia Continues on Pravachol, followed by [name] [name].    [name] [name], M.D., F.A.C.C.

## 2014-04-18 NOTE — Patient Instructions (Signed)
Your physician recommends that you schedule a follow-up appointment in: 4-6 weeks. Your physician recommends that you continue on your current medications as directed. Please refer to the Current Medication list given to you today. Your physician has requested that you have an echocardiogram. Echocardiography is a painless test that uses sound waves to create images of your heart. It provides your doctor with information about the size and shape of your heart and how well your heart's chambers and valves are working. This procedure takes approximately one hour. There are no restrictions for this procedure.

## 2014-04-23 ENCOUNTER — Encounter: Payer: Self-pay | Admitting: *Deleted

## 2014-04-24 ENCOUNTER — Other Ambulatory Visit (INDEPENDENT_AMBULATORY_CARE_PROVIDER_SITE_OTHER): Payer: Medicare Other

## 2014-04-24 ENCOUNTER — Other Ambulatory Visit: Payer: Self-pay

## 2014-04-24 DIAGNOSIS — I429 Cardiomyopathy, unspecified: Secondary | ICD-10-CM

## 2014-04-24 DIAGNOSIS — I441 Atrioventricular block, second degree: Secondary | ICD-10-CM

## 2014-04-24 DIAGNOSIS — I059 Rheumatic mitral valve disease, unspecified: Secondary | ICD-10-CM

## 2014-04-29 ENCOUNTER — Telehealth: Payer: Self-pay | Admitting: Cardiology

## 2014-04-29 NOTE — Telephone Encounter (Signed)
Mrs. Memory Argueance is asking for her test results of echo

## 2014-04-29 NOTE — Telephone Encounter (Signed)
Patient informed. 

## 2014-04-29 NOTE — Telephone Encounter (Signed)
Message copied by Eustace MooreANDERSON, Javanni Maring M on Mon Apr 29, 2014  2:24 PM ------      Message from: Eustace MooreANDERSON, Rosamond Andress M      Created: Mon Apr 29, 2014  7:40 AM                   ----- Message -----         From: Jonelle SidleSamuel G McDowell, MD         Sent: 04/25/2014  10:08 AM           To: Lesle ChrisAngela G Hill, LPN            Reviewed report. LV systolic function is stable compared to prior testing with mild diastolic dysfunction which we are managing medically in terms of volume status. She was stable at a recent visit. ------

## 2014-05-16 ENCOUNTER — Ambulatory Visit (INDEPENDENT_AMBULATORY_CARE_PROVIDER_SITE_OTHER): Payer: Medicare Other | Admitting: Cardiology

## 2014-05-16 ENCOUNTER — Encounter: Payer: Self-pay | Admitting: Cardiology

## 2014-05-16 VITALS — BP 130/64 | HR 64 | Ht 60.0 in | Wt 181.0 lb

## 2014-05-16 DIAGNOSIS — I5032 Chronic diastolic (congestive) heart failure: Secondary | ICD-10-CM

## 2014-05-16 DIAGNOSIS — I059 Rheumatic mitral valve disease, unspecified: Secondary | ICD-10-CM

## 2014-05-16 DIAGNOSIS — I441 Atrioventricular block, second degree: Secondary | ICD-10-CM

## 2014-05-16 DIAGNOSIS — I340 Nonrheumatic mitral (valve) insufficiency: Secondary | ICD-10-CM

## 2014-05-16 DIAGNOSIS — I10 Essential (primary) hypertension: Secondary | ICD-10-CM

## 2014-05-16 NOTE — Assessment & Plan Note (Signed)
Symptomatically stable, no change to current regimen. Followup in 3 months with BMET.

## 2014-05-16 NOTE — Patient Instructions (Signed)
Your physician recommends that you schedule a follow-up appointment in: 3 months. Your physician recommends that you continue on your current medications as directed. Please refer to the Current Medication list given to you today. Your physician recommends that you have non-fasting lab work in 3 months just before your next visit to check your BMET.

## 2014-05-16 NOTE — Assessment & Plan Note (Signed)
No change to current regimen. 

## 2014-05-16 NOTE — Progress Notes (Signed)
Clinical Summary [name] [name] is an 80 y.o.female last seen in April of this year. He is here with her daughter. Overall no change in symptoms. She reports compliance with her medications, also using supplemental oxygen and followed by Dr. Margo Commonapper.  Weight is stable compared to last visit.  Followup echocardiogram in April of this year showed mild LVH with LVEF 55-60%, grade 1 diastolic dysfunction, mildly thickened mitral leaflets with mild mitral regurgitation, mild left atrial enlargement.  Lab work from the April showed BUN 18, creatinine 0.6, sodium 139, potassium 3.6.   No Known Allergies  Current Outpatient Prescriptions  Medication Sig Dispense Refill  . acetaminophen (TYLENOL) 500 MG tablet Take 500 mg by mouth every 6 (six) hours as needed.        Marland Kitchen. aspirin 81 MG tablet Take 81 mg by mouth daily.        . carvedilol (COREG) 6.25 MG tablet Take 1 tablet (6.25 mg total) by mouth 2 (two) times daily with a meal.  60 tablet  6  . Cholecalciferol (VITAMIN D-3) 1000 UNITS CAPS Take 1 capsule by mouth daily.      . clonazePAM (KLONOPIN) 1 MG tablet Take 1 mg by mouth at bedtime.      . donepezil (ARICEPT) 10 MG tablet Take 10 mg by mouth at bedtime.      . furosemide (LASIX) 20 MG tablet Take 20 mg by mouth 2 (two) times daily.       Marland Kitchen. lisinopril (PRINIVIL,ZESTRIL) 20 MG tablet TAKE ONE TABLET BY MOUTH EVERY DAY  7 tablet  0  . metFORMIN (GLUCOPHAGE) 500 MG tablet Take 500 mg by mouth 2 (two) times daily with a meal.      . Multiple Vitamin (MULTIVITAMIN) tablet Take 1 tablet by mouth daily.      . potassium chloride (KLOR-CON) 8 MEQ CR tablet Take 8 mEq by mouth 3 (three) times daily.       . pravastatin (PRAVACHOL) 40 MG tablet Take 40 mg by mouth daily.        Marland Kitchen. tiZANidine (ZANAFLEX) 4 MG capsule Take 4 mg by mouth 2 (two) times daily.       No current facility-administered medications for this visit.    Past Medical History  Diagnosis Date  . Chronic diastolic heart failure    . Essential hypertension, benign   . Type 2 diabetes mellitus   . Mixed hyperlipidemia   . Osteoarthritis   . Gout   . Pulmonary hypertension     Moderate - secondary to diastolic dysfunction  . Panic attacks   . Symptomatic advanced heart block     Status post pacemaker placement    Past Surgical History  Procedure Laterality Date  . Pacemaker insertion  04/19/07    SJM Zephyr XL DR implanted by Dr Juanda ChanceBrodie for syncope and mobitz II AV block  . Ovarian cyst removal    . Breast biopsy    . Hemorrhoid surgery    . Abdominal hysterectomy      Social History Ms. Scallan reports that she has never smoked. She has never used smokeless tobacco. [name] [name] reports that she does not drink alcohol.  Review of Systems No palpitations or chest pain. Portable hearing. No orthopnea or PND. Stable appetite.  Physical Examination Filed Vitals:   05/16/14 1520  BP: 130/64  Pulse: 64   Filed Weights   05/16/14 1520  Weight: 181 lb (82.101 kg)    Overweight woman, appears comfortable. Wearing  oxygen.  HEENT: Conjunctiva and lids normal, oropharynx clear.  Neck: Supple, no elevated JVP or carotid bruits, no thyromegaly.  Lungs: Clear to auscultation, nonlabored breathing at rest.  Cardiac: Regular rate and rhythm, no S3, soft systolic murmur, no pericardial rub.  Abdomen: Soft, nontender, bowel sounds present.  Extremities: No edema, distal pulses 1-2+.  Skin: Warm and dry.  Musculoskeletal: No kyphosis.  Neuropsychiatric: Alert and oriented x3, affect grossly appropriate.   Problem List and Plan   Chronic diastolic heart failure Symptomatically stable, no change to current regimen. Followup in 3 months with [facility].  Mitral regurgitation Mild by followup echocardiogram.  Second degree Mobitz II AV block St. Jude pacemaker in place, followed by Dr. Johney FrameAllred.  Essential hypertension, benign No change to current regimen.    Jonelle SidleSamuel G. McDowell, M.D., F.A.C.C.

## 2014-05-16 NOTE — Assessment & Plan Note (Signed)
St. Jude pacemaker in place, followed by Dr. Johney FrameAllred.

## 2014-05-16 NOTE — Assessment & Plan Note (Signed)
Mild by followup echocardiogram.

## 2014-08-22 ENCOUNTER — Encounter: Payer: Self-pay | Admitting: Cardiology

## 2014-08-22 ENCOUNTER — Ambulatory Visit (INDEPENDENT_AMBULATORY_CARE_PROVIDER_SITE_OTHER): Payer: Medicare Other | Admitting: Cardiology

## 2014-08-22 VITALS — BP 156/69 | HR 56 | Ht 60.0 in | Wt 173.0 lb

## 2014-08-22 DIAGNOSIS — I340 Nonrheumatic mitral (valve) insufficiency: Secondary | ICD-10-CM

## 2014-08-22 DIAGNOSIS — I059 Rheumatic mitral valve disease, unspecified: Secondary | ICD-10-CM

## 2014-08-22 DIAGNOSIS — I5032 Chronic diastolic (congestive) heart failure: Secondary | ICD-10-CM

## 2014-08-22 DIAGNOSIS — I10 Essential (primary) hypertension: Secondary | ICD-10-CM

## 2014-08-22 DIAGNOSIS — I441 Atrioventricular block, second degree: Secondary | ICD-10-CM

## 2014-08-22 NOTE — Assessment & Plan Note (Signed)
Status post pacemaker placement, followed by Dr. Allred. 

## 2014-08-22 NOTE — Patient Instructions (Signed)

## 2014-08-22 NOTE — Progress Notes (Signed)
Clinical Summary [name] [name] is an 80 y.o.female last seen in May. She is here with her son. Fortunately, has been fairly stable, no cardiac hospitalizations. She states she walks in her driveway with a walker a few times a day, 40 feet distances at a time. She is not having any angina, no progressive leg edema. She reports compliance with her medications including low-dose diuretic regimen.  Recent lab work in August showed BUN 13, creatinine 1.0, potassium 4.1. Her weight is down approximately 7 pounds since earlier in the year.  Followup echocardiogram in April of this year showed mild LVH with LVEF 55-60%, grade 1 diastolic dysfunction, mildly thickened mitral leaflets with mild mitral regurgitation, mild left atrial enlargement.   No Known Allergies  Current Outpatient Prescriptions  Medication Sig Dispense Refill  . acetaminophen (TYLENOL) 500 MG tablet Take 500 mg by mouth 2 (two) times daily.       Marland Kitchen aspirin 81 MG tablet Take 81 mg by mouth daily.        . carvedilol (COREG) 6.25 MG tablet Take 1 tablet (6.25 mg total) by mouth 2 (two) times daily with a meal.  60 tablet  6  . Cholecalciferol (VITAMIN D-3) 1000 UNITS CAPS Take 1 capsule by mouth daily.      . clonazePAM (KLONOPIN) 1 MG tablet Take 1 mg by mouth at bedtime.      . donepezil (ARICEPT) 10 MG tablet Take 10 mg by mouth at bedtime.      . furosemide (LASIX) 20 MG tablet Take 20 mg by mouth 2 (two) times daily.       Marland Kitchen lisinopril (PRINIVIL,ZESTRIL) 20 MG tablet TAKE ONE TABLET BY MOUTH EVERY DAY  7 tablet  0  . metFORMIN (GLUCOPHAGE) 500 MG tablet Take 500 mg by mouth 3 (three) times daily. Takes 2 in AM and 1 in PM      . Multiple Vitamin (MULTIVITAMIN) tablet Take 1 tablet by mouth daily.      . potassium chloride (KLOR-CON) 8 MEQ CR tablet Take 8 mEq by mouth 4 (four) times daily. Take 2 in the morning and 2 in the afternoon.      . pravastatin (PRAVACHOL) 40 MG tablet Take 40 mg by mouth daily.        Marland Kitchen tiZANidine  (ZANAFLEX) 4 MG capsule Take 4 mg by mouth 2 (two) times daily.      . vitamin C (ASCORBIC ACID) 500 MG tablet Take 500 mg by mouth daily.       No current facility-administered medications for this visit.    Past Medical History  Diagnosis Date  . Chronic diastolic heart failure   . Essential hypertension, benign   . Type 2 diabetes mellitus   . Mixed hyperlipidemia   . Osteoarthritis   . Gout   . Pulmonary hypertension     Moderate - secondary to diastolic dysfunction  . Panic attacks   . Symptomatic advanced heart block     Status post pacemaker placement    Past Surgical History  Procedure Laterality Date  . Pacemaker insertion  04/19/07    SJM Zephyr XL DR implanted by Dr Juanda Chance for syncope and mobitz II AV block  . Ovarian cyst removal    . Breast biopsy    . Hemorrhoid surgery    . Abdominal hysterectomy      Social History [name] [name] reports that she has never smoked. She has never used smokeless tobacco. [name] [name] reports that she  does not drink alcohol.  Review of Systems No palpitations, dizziness, syncope. Chronic arthritic pains and joint stiffness. Wears oxygen chronically. Other systems reviewed and negative  Physical Examination Filed Vitals:   08/22/14 0915  BP: 156/69  Pulse: 56   Filed Weights   08/22/14 0915  Weight: 173 lb (78.472 kg)    Overweight woman, appears comfortable. Wearing oxygen.  HEENT: Conjunctiva and lids normal, oropharynx clear.  Neck: Supple, no elevated JVP or carotid bruits, no thyromegaly.  Lungs: Clear to auscultation, nonlabored breathing at rest.  Cardiac: Regular rate and rhythm, no S3, soft systolic murmur, no pericardial rub.  Abdomen: Soft, nontender, bowel sounds present.  Extremities: No edema, distal pulses 1-2+.  Skin: Warm and dry.  Musculoskeletal: No kyphosis.  Neuropsychiatric: Alert and oriented x3, affect grossly appropriate.   Problem List and Plan   Chronic diastolic heart failure Volume status  looks good on examination, her weight is down. Recent renal function in normal range on current diuretic regimen. No changes made today.  Essential hypertension, benign Blood pressure elevated this morning, she had not yet taken her morning medications.  Mitral regurgitation Mild by echocardiogram earlier in the year.  Second degree Mobitz II AV block Status post pacemaker placement, followed by Dr. Johney Frame.    Jonelle Sidle, M.D., F.A.C.C.

## 2014-08-22 NOTE — Assessment & Plan Note (Signed)
Mild by echocardiogram earlier in the year.

## 2014-08-22 NOTE — Assessment & Plan Note (Signed)
Blood pressure elevated this morning, she had not yet taken her morning medications.

## 2014-08-22 NOTE — Assessment & Plan Note (Signed)
Volume status looks good on examination, her weight is down. Recent renal function in normal range on current diuretic regimen. No changes made today.

## 2014-11-06 ENCOUNTER — Encounter: Payer: Self-pay | Admitting: *Deleted

## 2014-12-24 ENCOUNTER — Encounter: Payer: Self-pay | Admitting: *Deleted

## 2015-02-18 ENCOUNTER — Encounter: Payer: Self-pay | Admitting: *Deleted

## 2015-04-01 ENCOUNTER — Ambulatory Visit (INDEPENDENT_AMBULATORY_CARE_PROVIDER_SITE_OTHER): Payer: Medicare Other | Admitting: Cardiology

## 2015-04-01 ENCOUNTER — Encounter: Payer: Self-pay | Admitting: Cardiology

## 2015-04-01 VITALS — BP 168/88 | HR 64 | Ht 60.0 in | Wt 177.4 lb

## 2015-04-01 DIAGNOSIS — I5032 Chronic diastolic (congestive) heart failure: Secondary | ICD-10-CM | POA: Diagnosis not present

## 2015-04-01 DIAGNOSIS — I10 Essential (primary) hypertension: Secondary | ICD-10-CM | POA: Diagnosis not present

## 2015-04-01 DIAGNOSIS — I441 Atrioventricular block, second degree: Secondary | ICD-10-CM

## 2015-04-01 NOTE — Patient Instructions (Signed)

## 2015-04-01 NOTE — Progress Notes (Signed)
Cardiology Office Note  Date: 04/01/2015   ID: [name] [name], DOB 1927/08/09, MRN [id number]  PCP: Louie Boston, MD  Primary Cardiologist: Nona Dell, MD   Chief Complaint  Patient presents with  . Diastolic heart failure    History of Present Illness: [name] [name] is an 81 y.o. female last seen in August 2015. She is here with her son today for a follow-up visit. Overall, no significant change in functional capacity. She is fairly limited at baseline, uses a cane and also a rolling walker to get around. She denies any recent falls.  Weight is up 4 pounds from the last visit. She continues on Lasix 20 mg in the morning with potassium supplements. She has had no orthopnea or PND, no cardiac hospitalizations in the interim.  Past Medical History  Diagnosis Date  . Chronic diastolic heart failure   . Essential hypertension, benign   . Type 2 diabetes mellitus   . Mixed hyperlipidemia   . Osteoarthritis   . Gout   . Pulmonary hypertension     Moderate - secondary to diastolic dysfunction  . Panic attacks   . Symptomatic advanced heart block     Status post pacemaker placement    Current Outpatient Prescriptions  Medication Sig Dispense Refill  . acetaminophen (TYLENOL) 500 MG tablet Take 500 mg by mouth 2 (two) times daily.     Marland Kitchen aspirin 81 MG tablet Take 81 mg by mouth daily.      . carvedilol (COREG) 6.25 MG tablet Take 1 tablet (6.25 mg total) by mouth 2 (two) times daily with a meal. 60 tablet 6  . Cholecalciferol (VITAMIN D-3) 1000 UNITS CAPS Take 1 capsule by mouth daily.    . clonazePAM (KLONOPIN) 1 MG tablet Take 1 mg by mouth at bedtime.    . donepezil (ARICEPT) 10 MG tablet Take 10 mg by mouth at bedtime.    . furosemide (LASIX) 20 MG tablet Take 20 mg by mouth every morning.     Marland Kitchen lisinopril (PRINIVIL,ZESTRIL) 20 MG tablet TAKE ONE TABLET BY MOUTH EVERY DAY 7 tablet 0  . metFORMIN (GLUCOPHAGE) 500 MG tablet Take 500 mg by mouth 3 (three) times daily.  Takes 2 in AM and 1 in PM    . Multiple Vitamin (MULTIVITAMIN) tablet Take 1 tablet by mouth daily.    . potassium chloride (KLOR-CON) 8 MEQ CR tablet Take 8 mEq by mouth 4 (four) times daily. Take 2 in the morning and 2 in the afternoon.    . pravastatin (PRAVACHOL) 40 MG tablet Take 40 mg by mouth daily.      Marland Kitchen tiZANidine (ZANAFLEX) 4 MG capsule Take 4 mg by mouth 2 (two) times daily.    . vitamin C (ASCORBIC ACID) 500 MG tablet Take 500 mg by mouth daily.     No current facility-administered medications for this visit.    Allergies:  Review of patient's allergies indicates no known allergies.   Social History: The patient  reports that she has never smoked. She has never used smokeless tobacco. She reports that she does not drink alcohol or use illicit drugs.   ROS:  Please see the history of present illness. Otherwise, complete review of systems is positive for mild intermittent leg swelling.  All other systems are reviewed and negative.   Physical Exam: VS:  BP 168/88 mmHg  Pulse 64  Ht 5' (1.524 m)  Wt 177 lb 6.4 oz (80.468 kg)  BMI 34.65 kg/m2  SpO2 94%, BMI Body mass index is 34.65 kg/(m^2).  Wt Readings from Last 3 Encounters:  04/01/15 177 lb 6.4 oz (80.468 kg)  08/22/14 173 lb (78.472 kg)  05/16/14 181 lb (82.101 kg)     Overweight woman, appears comfortable. Wearing oxygen.  HEENT: Conjunctiva and lids normal, oropharynx clear.  Neck: Supple, no elevated JVP or carotid bruits, no thyromegaly.  Lungs: Clear to auscultation, nonlabored breathing at rest.  Cardiac: Regular rate and rhythm, no S3, soft systolic murmur, no pericardial rub.  Abdomen: Soft, nontender, bowel sounds present.  Extremities: Trace ankle edema, distal pulses 1-2+.  Skin: Warm and dry.  Musculoskeletal: No kyphosis.  Neuropsychiatric: Alert and oriented x3, affect grossly appropriate.   ECG: ECG is not ordered today.   Recent Labwork:  08/20/2014 BUN 13, creatinine 1.0, potassium  4.1  Other Studies Reviewed Today:  Followup echocardiogram in April 2015 showed mild LVH with LVEF 55-60%, grade 1 diastolic dysfunction, mildly thickened mitral leaflets with mild mitral regurgitation, mild left atrial enlargement.   Assessment and Plan:  1. Chronic diastolic heart failure, clinically stable. Continue low-dose Lasix with potassium supplements, Coreg, and lisinopril.  2. Hypertension, blood pressure is elevated today. We discussed sodium restriction, keep follow-up with Dr. Margo Commonapper.  3. History of symptomatic heart block status post St. Jude pacemaker placement, followed by Dr. Johney FrameAllred.  Current medicines were reviewed with the patient today.   Orders Placed This Encounter  Procedures  . EKG 12-Lead    Disposition: FU with me in 6 months.   Signed, Jonelle SidleSamuel G. Twan Harkin, MD, [facility] HospitalFACC 04/01/2015 4:36 PM    [facility] [address], [place], [place] [address] Phone: [phone]) [phone]; Fax: [phone]

## 2015-06-06 ENCOUNTER — Encounter: Payer: Self-pay | Admitting: Internal Medicine

## 2015-06-06 ENCOUNTER — Ambulatory Visit (INDEPENDENT_AMBULATORY_CARE_PROVIDER_SITE_OTHER): Payer: Medicare Other | Admitting: Internal Medicine

## 2015-06-06 VITALS — BP 100/80 | HR 55 | Ht 64.0 in | Wt 174.0 lb

## 2015-06-06 DIAGNOSIS — I441 Atrioventricular block, second degree: Secondary | ICD-10-CM | POA: Diagnosis not present

## 2015-06-06 DIAGNOSIS — I10 Essential (primary) hypertension: Secondary | ICD-10-CM

## 2015-06-06 DIAGNOSIS — I5032 Chronic diastolic (congestive) heart failure: Secondary | ICD-10-CM | POA: Diagnosis not present

## 2015-06-06 LAB — CUP PACEART INCLINIC DEVICE CHECK
Brady Statistic RA Percent Paced: 25 %
Brady Statistic RV Percent Paced: 99 %
Date Time Interrogation Session: 20160610123308
Lead Channel Impedance Value: 342 Ohm
Lead Channel Pacing Threshold Amplitude: 0.75 V
Lead Channel Pacing Threshold Pulse Width: 0.4 ms
Lead Channel Pacing Threshold Pulse Width: 0.4 ms
Lead Channel Sensing Intrinsic Amplitude: 1.7 mV
Lead Channel Setting Pacing Pulse Width: 0.4 ms
MDC IDC MSMT BATTERY IMPEDANCE: 1800 Ohm
MDC IDC MSMT BATTERY VOLTAGE: 2.76 V
MDC IDC MSMT LEADCHNL RA PACING THRESHOLD AMPLITUDE: 0.75 V
MDC IDC MSMT LEADCHNL RV IMPEDANCE VALUE: 293 Ohm
MDC IDC PG SERIAL: 1898220
MDC IDC SET LEADCHNL RA PACING AMPLITUDE: 2 V
MDC IDC SET LEADCHNL RV SENSING SENSITIVITY: 1.5 mV
Pulse Gen Model: 5826

## 2015-06-06 NOTE — Progress Notes (Signed)
PCP: Louie Boston, MD Primary Cardiologist:  Dr Thomasena Edis is a 81 y.o. female who presents today for routine electrophysiology followup.  Since last being seen in our clinic, the patient reports being at her baseline.  She has chronic SOB for which she is on O2 at home.  Her son accompanies her today and reports that she has significant Alzheimers dementia. Today, she denies symptoms of palpitations, chest pain, lower extremity edema, dizziness, presyncope, or syncope.  The patient is otherwise without complaint today.  She says that her family helps with her medicines and she is not certain as to what medicines she is presently taking.  Past Medical History  Diagnosis Date  . Chronic diastolic heart failure   . Essential hypertension, benign   . Type 2 diabetes mellitus   . Mixed hyperlipidemia   . Osteoarthritis   . Gout   . Pulmonary hypertension     Moderate - secondary to diastolic dysfunction  . Panic attacks   . Symptomatic advanced heart block     Status post pacemaker placement   Past Surgical History  Procedure Laterality Date  . Pacemaker insertion  04/19/07    SJM Zephyr XL DR implanted by Dr Juanda Chance for syncope and mobitz II AV block  . Ovarian cyst removal    . Breast biopsy    . Hemorrhoid surgery    . Abdominal hysterectomy      Current Outpatient Prescriptions  Medication Sig Dispense Refill  . acetaminophen (TYLENOL) 500 MG tablet Take 500 mg by mouth 2 (two) times daily.     Marland Kitchen aspirin 81 MG tablet Take 81 mg by mouth daily.      . carvedilol (COREG) 6.25 MG tablet Take 1 tablet (6.25 mg total) by mouth 2 (two) times daily with a meal. 60 tablet 6  . Cholecalciferol (VITAMIN D-3) 1000 UNITS CAPS Take 1 capsule by mouth daily.    . clonazePAM (KLONOPIN) 1 MG tablet Take 1 mg by mouth at bedtime.    . donepezil (ARICEPT) 10 MG tablet Take 10 mg by mouth at bedtime.    . furosemide (LASIX) 20 MG tablet Take 20 mg by mouth every morning.     Marland Kitchen  lisinopril (PRINIVIL,ZESTRIL) 20 MG tablet TAKE ONE TABLET BY MOUTH EVERY DAY 7 tablet 0  . metFORMIN (GLUCOPHAGE) 500 MG tablet Take 500 mg by mouth 3 (three) times daily. Takes 2 in AM and 1 in PM    . Multiple Vitamin (MULTIVITAMIN) tablet Take 1 tablet by mouth daily.    . potassium chloride (KLOR-CON) 8 MEQ CR tablet Take 8 mEq by mouth 4 (four) times daily. Take 2 in the morning and 2 in the afternoon.    . pravastatin (PRAVACHOL) 40 MG tablet Take 40 mg by mouth daily.      Marland Kitchen tiZANidine (ZANAFLEX) 4 MG capsule Take 4 mg by mouth 2 (two) times daily.    . vitamin C (ASCORBIC ACID) 500 MG tablet Take 500 mg by mouth daily.     No current facility-administered medications for this visit.    Physical Exam: Filed Vitals:   06/06/15 1158  BP: 100/80  Pulse: 55  Height:  (1.626 m)  Weight: 78.926 kg (174 lb)  SpO2: 92%    GEN- The patient is chronically ill and fatigued appearing, alert and oriented x 3 today.   Head- normocephalic, atraumatic Eyes-  Sclera clear, conjunctiva pink Ears- hearing intact Oropharynx- clear Lungs- Clear to ausculation bilaterally,  normal work of breathing, wearing O2 today Chest- pacemaker pocket is well healed Heart- Regular rate and rhythm, no murmurs, rubs or gallops, PMI not laterally displaced GI- soft, NT, ND, + BS Extremities- no clubbing, cyanosis, or edema  Pacemaker interrogation- reviewed in detail today,  See PACEART report  Assessment and Plan:  1. Syncope/ mobitz II AV block Normal pacemaker function See Pace Art report No changes today  2. HTN Stable No change required today  3. Diastolic dysfunction Stable  Return to see Belenda Cruise in 6 months I will see again in 1year

## 2015-06-06 NOTE — Patient Instructions (Signed)
Your physician recommends that you continue on your current medications as directed. Please refer to the Current Medication list given to you today. Your physician recommends that you schedule a follow-up appointment in: 6 months in the device clinic with Kristen. You will receive a reminder letter in the mail in about 4 months reminding you to call and schedule your appointment. If you don't receive this letter, please contact our office. Your physician recommends that you schedule a follow-up appointment in: 12 months with Dr. Allred. You will receive a reminder letter in the mail in about 10 months reminding you to call and schedule your appointment. If you don't receive this letter, please contact our office. 

## 2015-07-01 ENCOUNTER — Encounter: Payer: Self-pay | Admitting: Internal Medicine

## 2015-09-18 ENCOUNTER — Encounter: Payer: Self-pay | Admitting: Cardiology

## 2015-09-18 ENCOUNTER — Ambulatory Visit (INDEPENDENT_AMBULATORY_CARE_PROVIDER_SITE_OTHER): Payer: Medicare Other | Admitting: Cardiology

## 2015-09-18 VITALS — BP 170/75 | HR 55 | Ht 64.0 in | Wt 174.1 lb

## 2015-09-18 DIAGNOSIS — Z950 Presence of cardiac pacemaker: Secondary | ICD-10-CM | POA: Diagnosis not present

## 2015-09-18 DIAGNOSIS — I5032 Chronic diastolic (congestive) heart failure: Secondary | ICD-10-CM | POA: Diagnosis not present

## 2015-09-18 DIAGNOSIS — I10 Essential (primary) hypertension: Secondary | ICD-10-CM | POA: Diagnosis not present

## 2015-09-18 MED ORDER — LISINOPRIL 20 MG PO TABS: 30.0000 mg | ORAL_TABLET | Freq: Every day | ORAL | Status: DC

## 2015-09-18 NOTE — Patient Instructions (Signed)
Your physician has recommended you make the following change in your medication:  Increase your lisinopril to 30 mg daily. Please take 1 &1/2 tablets daily of your 20 mg tablet. Continue all other medications the same. Your physician recommends that you schedule a follow-up appointment in: 6 months. You will receive a reminder letter in the mail in about 4 months reminding you to call and schedule your appointment. If you don't receive this letter, please contact our office.

## 2015-09-18 NOTE — Progress Notes (Signed)
Cardiology Office Note  Date: 09/18/2015   ID: Kaliah, Haddaway 1927-12-19, MRN [id number]  PCP: Louie Boston, MD  Primary Cardiologist: Nona Dell, MD   Chief Complaint  Patient presents with  . Diastolic heart failure    History of Present Illness: [name] [name] is an 81 y.o. female last seen in April. Interval visit with Dr. Johney Frame noted in June. She is here with her son today. Main complaint is of arthritic pains and soreness in her right shoulder. She has limited functional status, uses a walker, lives at home with her son who cares for her. He assists with her medications.  She has dementia and decreased hearing. She would not be able to subsist without help from her son. She continues to follow with Dr. Margo Common for primary care.  She has had no syncope or recent falls.   Past Medical History  Diagnosis Date  . Chronic diastolic heart failure   . Essential hypertension, benign   . Type 2 diabetes mellitus   . Mixed hyperlipidemia   . Osteoarthritis   . Gout   . Pulmonary hypertension     Moderate - secondary to diastolic dysfunction  . Panic attacks   . Symptomatic advanced heart block     Status post pacemaker placement     Current Outpatient Prescriptions  Medication Sig Dispense Refill  . acetaminophen (TYLENOL) 500 MG tablet Take 500 mg by mouth 2 (two) times daily.     Marland Kitchen aspirin 81 MG tablet Take 81 mg by mouth daily.      . carvedilol (COREG) 6.25 MG tablet Take 1 tablet (6.25 mg total) by mouth 2 (two) times daily with a meal. 60 tablet 6  . Cholecalciferol (VITAMIN D-3) 1000 UNITS CAPS Take 1 capsule by mouth daily.    . clonazePAM (KLONOPIN) 1 MG tablet Take 1 mg by mouth at bedtime.    . donepezil (ARICEPT) 10 MG tablet Take 10 mg by mouth at bedtime.    . furosemide (LASIX) 20 MG tablet Take 20 mg by mouth every morning.     Marland Kitchen lisinopril (PRINIVIL,ZESTRIL) 20 MG tablet Take 1.5 tablets (30 mg total) by mouth daily. 45 tablet 6  . metFORMIN  (GLUCOPHAGE) 500 MG tablet Take 500 mg by mouth 3 (three) times daily. Takes 2 in AM and 1 in PM    . Multiple Vitamin (MULTIVITAMIN) tablet Take 1 tablet by mouth daily.    . potassium chloride (KLOR-CON) 8 MEQ CR tablet Take 8 mEq by mouth 4 (four) times daily. Take 2 in the morning and 2 in the afternoon.    . pravastatin (PRAVACHOL) 40 MG tablet Take 40 mg by mouth daily.      Marland Kitchen tiZANidine (ZANAFLEX) 4 MG capsule Take 4 mg by mouth 2 (two) times daily.    . vitamin C (ASCORBIC ACID) 500 MG tablet Take 500 mg by mouth daily.     No current facility-administered medications for this visit.    Allergies:  Review of patient's allergies indicates no known allergies.   Social History: The patient  reports that she has never smoked. She has never used smokeless tobacco. She reports that she does not drink alcohol or use illicit drugs.   ROS:  Please see the history of present illness. Otherwise, complete review of systems is positive for none.  All other systems are reviewed and negative.   Physical Exam: VS:  BP 170/75 mmHg  Pulse 55  Ht  (1.626  m)  Wt 174 lb 1.9 oz (78.98 kg)  BMI 29.87 kg/m2  SpO2 96%, BMI Body mass index is 29.87 kg/(m^2).  Wt Readings from Last 3 Encounters:  09/18/15 174 lb 1.9 oz (78.98 kg)  06/06/15 174 lb (78.926 kg)  04/01/15 177 lb 6.4 oz (80.468 kg)     Overweight woman, appears comfortable. Wearing oxygen.  HEENT: Conjunctiva and lids normal, oropharynx clear.  Neck: Supple, no elevated JVP or carotid bruits, no thyromegaly.  Lungs: Clear to auscultation, nonlabored breathing at rest.  Cardiac: Regular rate and rhythm, no S3, soft systolic murmur, no pericardial rub.  Abdomen: Soft, nontender, bowel sounds present.  Extremities: Trace ankle edema, distal pulses 1-2+.  Skin: Warm and dry.  Musculoskeletal: No kyphosis.  Neuropsychiatric: Alert and oriented x3, affect grossly appropriate.   ECG: ECG is not ordered today.  Other  Studies Reviewed Today:  Study Conclusions  - Study data: Technically difficult study. - Left ventricle: The cavity size was normal. Wall thickness was increased in a pattern of mild LVH. Systolic function was normal. The estimated ejection fraction was in the range of 55% to 60%. Doppler parameters are consistent with abnormal left ventricular relaxation (grade 1 diastolic dysfunction). - Aortic valve: Technically difficult study, continous wave Doppler of the AV was not able to be obtained. Grossly there is no significant stenosis or regurgitation. - Mitral valve: Mildly calcified annulus. Mildly thickened leaflets . Mild regurgitation. - Left atrium: The atrium was mildly dilated.  Assessment and Plan:  1. Chronic diastolic heart failure, weight is stable compared to the last visit. No change in current diuretic regimen.  2. Essential hypertension, increase lisinopril to 30 mg daily.  3. History of heart block status post St. Jude pacemaker, followed by Dr. Johney Frame.  Current medicines were reviewed with the patient today.  Disposition: FU with me in 6 months.   Signed, Jonelle Sidle, MD, [facility] 09/18/2015 3:24 PM    [facility] at [facility] [address] [place], [place], [place] [address] Phone: [phone]; Fax: [phone]

## 2015-11-28 ENCOUNTER — Ambulatory Visit (INDEPENDENT_AMBULATORY_CARE_PROVIDER_SITE_OTHER): Payer: Medicare Other | Admitting: *Deleted

## 2015-11-28 DIAGNOSIS — I441 Atrioventricular block, second degree: Secondary | ICD-10-CM | POA: Diagnosis not present

## 2015-11-28 DIAGNOSIS — Z950 Presence of cardiac pacemaker: Secondary | ICD-10-CM

## 2015-11-28 LAB — CUP PACEART INCLINIC DEVICE CHECK
Battery Impedance: 2000 Ohm
Battery Voltage: 2.76 V
Brady Statistic RA Percent Paced: 24 %
Brady Statistic RV Percent Paced: 99 %
Implantable Lead Implant Date: 20080423
Implantable Lead Location: 753859
Lead Channel Impedance Value: 318 Ohm
Lead Channel Impedance Value: 339 Ohm
Lead Channel Pacing Threshold Amplitude: 0.75 V
Lead Channel Pacing Threshold Pulse Width: 0.4 ms
Lead Channel Sensing Intrinsic Amplitude: 1.4 mV
Lead Channel Setting Sensing Sensitivity: 1.5 mV
MDC IDC LEAD IMPLANT DT: 20080423
MDC IDC LEAD LOCATION: 753860
MDC IDC MSMT LEADCHNL RV PACING THRESHOLD AMPLITUDE: 0.75 V
MDC IDC MSMT LEADCHNL RV PACING THRESHOLD PULSEWIDTH: 0.4 ms
MDC IDC SESS DTM: 20161202155344
MDC IDC SET LEADCHNL RA PACING AMPLITUDE: 2 V
MDC IDC SET LEADCHNL RV PACING PULSEWIDTH: 0.4 ms
Pulse Gen Model: 5826
Pulse Gen Serial Number: 1898220

## 2015-11-28 NOTE — Progress Notes (Signed)
Pacemaker check in clinic. Normal device function. Thresholds, sensing, impedances consistent with previous measurements. Device programmed to maximize longevity. No mode switches. No episode triggers enabled. Device programmed at appropriate safety margins. Histogram distribution appropriate for patient activity level. Device programmed to optimize intrinsic conduction. Estimated longevity 4.25-5.75 years. Patient education completed. ROV with JA in [place] in 6 months.

## 2015-12-16 ENCOUNTER — Encounter: Payer: Self-pay | Admitting: Internal Medicine

## 2016-04-22 ENCOUNTER — Encounter: Payer: Self-pay | Admitting: Cardiology

## 2016-04-22 ENCOUNTER — Encounter: Payer: Medicare Other | Admitting: Cardiology

## 2016-04-22 NOTE — Progress Notes (Signed)
No show  This encounter was created in error - please disregard.

## 2016-05-31 ENCOUNTER — Emergency Department (HOSPITAL_COMMUNITY): Payer: Medicare Other

## 2016-05-31 ENCOUNTER — Encounter (HOSPITAL_COMMUNITY): Payer: Self-pay | Admitting: Emergency Medicine

## 2016-05-31 ENCOUNTER — Observation Stay (HOSPITAL_COMMUNITY)
Admission: EM | Admit: 2016-05-31 | Discharge: 2016-06-01 | Disposition: A | Discharge status: Skilled Nursing Facility | Payer: Medicare Other | Source: Other Acute Inpatient Hospital | Attending: Interventional Cardiology | Admitting: Interventional Cardiology

## 2016-05-31 DIAGNOSIS — Z7982 Long term (current) use of aspirin: Secondary | ICD-10-CM | POA: Diagnosis not present

## 2016-05-31 DIAGNOSIS — I5032 Chronic diastolic (congestive) heart failure: Secondary | ICD-10-CM | POA: Diagnosis present

## 2016-05-31 DIAGNOSIS — Z7984 Long term (current) use of oral hypoglycemic drugs: Secondary | ICD-10-CM | POA: Diagnosis not present

## 2016-05-31 DIAGNOSIS — E782 Mixed hyperlipidemia: Secondary | ICD-10-CM | POA: Insufficient documentation

## 2016-05-31 DIAGNOSIS — Z950 Presence of cardiac pacemaker: Secondary | ICD-10-CM

## 2016-05-31 DIAGNOSIS — Z79899 Other long term (current) drug therapy: Secondary | ICD-10-CM | POA: Diagnosis not present

## 2016-05-31 DIAGNOSIS — F0390 Unspecified dementia without behavioral disturbance: Secondary | ICD-10-CM | POA: Diagnosis not present

## 2016-05-31 DIAGNOSIS — E119 Type 2 diabetes mellitus without complications: Secondary | ICD-10-CM | POA: Insufficient documentation

## 2016-05-31 DIAGNOSIS — Z8719 Personal history of other diseases of the digestive system: Secondary | ICD-10-CM | POA: Insufficient documentation

## 2016-05-31 DIAGNOSIS — R079 Chest pain, unspecified: Secondary | ICD-10-CM | POA: Diagnosis present

## 2016-05-31 DIAGNOSIS — I110 Hypertensive heart disease with heart failure: Secondary | ICD-10-CM | POA: Diagnosis not present

## 2016-05-31 DIAGNOSIS — I5033 Acute on chronic diastolic (congestive) heart failure: Secondary | ICD-10-CM | POA: Diagnosis not present

## 2016-05-31 DIAGNOSIS — R0602 Shortness of breath: Secondary | ICD-10-CM

## 2016-05-31 DIAGNOSIS — E785 Hyperlipidemia, unspecified: Secondary | ICD-10-CM | POA: Insufficient documentation

## 2016-05-31 DIAGNOSIS — I10 Essential (primary) hypertension: Secondary | ICD-10-CM | POA: Diagnosis not present

## 2016-05-31 LAB — COMPREHENSIVE METABOLIC PANEL
ALK PHOS: 40 U/L (ref 38–126)
ALT: 12 U/L — ABNORMAL LOW (ref 14–54)
ANION GAP: 4 — AB (ref 5–15)
AST: 14 U/L — ABNORMAL LOW (ref 15–41)
Albumin: 3.5 g/dL (ref 3.5–5.0)
BUN: 12 mg/dL (ref 6–20)
CALCIUM: 9 mg/dL (ref 8.9–10.3)
CO2: 32 mmol/L (ref 22–32)
Chloride: 103 mmol/L (ref 101–111)
Creatinine, Ser: 0.71 mg/dL (ref 0.44–1.00)
GFR calc non Af Amer: >60 mL/min (ref >60)
Glucose, Bld: 153 mg/dL — ABNORMAL HIGH (ref 65–99)
POTASSIUM: 3.7 mmol/L (ref 3.5–5.1)
SODIUM: 139 mmol/L (ref 135–145)
Total Bilirubin: 0.4 mg/dL (ref 0.3–1.2)
Total Protein: 6.2 g/dL — ABNORMAL LOW (ref 6.5–8.1)

## 2016-05-31 LAB — URINE MICROSCOPIC-ADD ON: RBC / HPF: NONE SEEN RBC/hpf (ref 0–5)

## 2016-05-31 LAB — URINALYSIS, ROUTINE W REFLEX MICROSCOPIC
Bilirubin Urine: NEGATIVE
Glucose, UA: NEGATIVE mg/dL
Hgb urine dipstick: NEGATIVE
Ketones, ur: NEGATIVE mg/dL
Leukocytes, UA: NEGATIVE
Nitrite: NEGATIVE
Protein, ur: 30 mg/dL — AB
Specific Gravity, Urine: 1.01 (ref 1.005–1.030)
pH: 7.5 (ref 5.0–8.0)

## 2016-05-31 LAB — CBC WITH DIFFERENTIAL/PLATELET
BASOS ABS: 0 10*3/uL (ref 0.0–0.1)
BASOS PCT: 0 %
Eosinophils Absolute: 0.2 10*3/uL (ref 0.0–0.7)
Eosinophils Relative: 4 %
HEMATOCRIT: 35.3 % — AB (ref 36.0–46.0)
HEMOGLOBIN: 11.5 g/dL — AB (ref 12.0–15.0)
Lymphocytes Relative: 27 %
Lymphs Abs: 1.9 10*3/uL (ref 0.7–4.0)
MCH: 29.1 pg (ref 26.0–34.0)
MCHC: 32.6 g/dL (ref 30.0–36.0)
MCV: 89.4 fL (ref 78.0–100.0)
MONOS PCT: 8 %
Monocytes Absolute: 0.6 10*3/uL (ref 0.1–1.0)
NEUTROS ABS: 4.2 10*3/uL (ref 1.7–7.7)
NEUTROS PCT: 61 %
Platelets: 174 10*3/uL (ref 150–400)
RBC: 3.95 MIL/uL (ref 3.87–5.11)
RDW: 13.9 % (ref 11.5–15.5)
WBC: 6.9 10*3/uL (ref 4.0–10.5)

## 2016-05-31 LAB — I-STAT TROPONIN, ED: Troponin i, poc: 0 ng/mL (ref 0.00–0.08)

## 2016-05-31 LAB — GLUCOSE, CAPILLARY
GLUCOSE-CAPILLARY: 147 mg/dL — AB (ref 65–99)
GLUCOSE-CAPILLARY: 142 mg/dL — AB (ref 65–99)

## 2016-05-31 LAB — BRAIN NATRIURETIC PEPTIDE: B NATRIURETIC PEPTIDE 5: 148.2 pg/mL — AB (ref 0.0–100.0)

## 2016-05-31 LAB — TROPONIN I: Troponin I: <0.03 ng/mL (ref <0.031)

## 2016-05-31 LAB — D-DIMER, QUANTITATIVE: D-Dimer, Quant: 0.35 ug/mL-FEU (ref 0.00–0.50)

## 2016-05-31 MED ORDER — DONEPEZIL HCL 10 MG PO TABS
10.0000 mg | ORAL_TABLET | Freq: Every day | ORAL | Status: DC
  Administered 2016-05-31: 10 mg via ORAL
  Filled 2016-05-31: qty 1

## 2016-05-31 MED ORDER — HEPARIN SODIUM (PORCINE) 5000 UNIT/ML IJ SOLN
5000.0000 [IU] | Freq: Three times a day (TID) | INTRAMUSCULAR | Status: DC
  Administered 2016-05-31 – 2016-06-01 (×4): 5000 [IU] via SUBCUTANEOUS
  Filled 2016-05-31 (×4): qty 1

## 2016-05-31 MED ORDER — HYDRALAZINE HCL 20 MG/ML IJ SOLN: 5.0000 mg | Freq: Four times a day (QID) | INTRAMUSCULAR | Status: DC | PRN

## 2016-05-31 MED ORDER — AMLODIPINE BESYLATE 5 MG PO TABS
5.0000 mg | ORAL_TABLET | Freq: Every day | ORAL | Status: DC
  Administered 2016-05-31 – 2016-06-01 (×2): 5 mg via ORAL
  Filled 2016-05-31 (×2): qty 1

## 2016-05-31 MED ORDER — SODIUM CHLORIDE 0.9 % IV SOLN: 250.0000 mL | INTRAVENOUS | Status: DC | PRN

## 2016-05-31 MED ORDER — ONDANSETRON HCL 4 MG/2ML IJ SOLN: 4.0000 mg | Freq: Three times a day (TID) | INTRAMUSCULAR | Status: DC | PRN

## 2016-05-31 MED ORDER — LORAZEPAM 2 MG/ML IJ SOLN
INTRAMUSCULAR | Status: AC
  Filled 2016-05-31: qty 1

## 2016-05-31 MED ORDER — INSULIN ASPART 100 UNIT/ML ~~LOC~~ SOLN: 0.0000 [IU] | Freq: Every day | SUBCUTANEOUS | Status: DC

## 2016-05-31 MED ORDER — SODIUM CHLORIDE 0.9% FLUSH
3.0000 mL | Freq: Two times a day (BID) | INTRAVENOUS | Status: DC
  Administered 2016-05-31 – 2016-06-01 (×3): 3 mL via INTRAVENOUS

## 2016-05-31 MED ORDER — SODIUM CHLORIDE 0.9% FLUSH: 3.0000 mL | INTRAVENOUS | Status: DC | PRN

## 2016-05-31 MED ORDER — LORAZEPAM 2 MG/ML IJ SOLN
0.5000 mg | Freq: Once | INTRAMUSCULAR | Status: AC
  Administered 2016-05-31: 0.5 mg via INTRAVENOUS

## 2016-05-31 MED ORDER — METFORMIN HCL 500 MG PO TABS
500.0000 mg | ORAL_TABLET | Freq: Two times a day (BID) | ORAL | Status: DC
  Administered 2016-05-31 – 2016-06-01 (×3): 500 mg via ORAL
  Filled 2016-05-31 (×3): qty 1

## 2016-05-31 MED ORDER — ONE-DAILY MULTI VITAMINS PO TABS: 1.0000 | ORAL_TABLET | Freq: Every day | ORAL | Status: DC

## 2016-05-31 MED ORDER — LORAZEPAM 0.5 MG PO TABS
0.5000 mg | ORAL_TABLET | Freq: Two times a day (BID) | ORAL | Status: DC | PRN
  Filled 2016-05-31: qty 1

## 2016-05-31 MED ORDER — ONDANSETRON HCL 4 MG/2ML IJ SOLN: 4.0000 mg | Freq: Four times a day (QID) | INTRAMUSCULAR | Status: DC | PRN

## 2016-05-31 MED ORDER — ADULT MULTIVITAMIN W/MINERALS CH
1.0000 | ORAL_TABLET | Freq: Every day | ORAL | Status: DC
  Administered 2016-06-01: 1 via ORAL
  Filled 2016-05-31: qty 1

## 2016-05-31 MED ORDER — ACETAMINOPHEN 325 MG PO TABS
650.0000 mg | ORAL_TABLET | ORAL | Status: DC | PRN
  Administered 2016-05-31: 650 mg via ORAL
  Filled 2016-05-31: qty 2

## 2016-05-31 MED ORDER — FUROSEMIDE 10 MG/ML IJ SOLN
40.0000 mg | Freq: Once | INTRAMUSCULAR | Status: AC
  Administered 2016-05-31: 40 mg via INTRAVENOUS
  Filled 2016-05-31: qty 4

## 2016-05-31 MED ORDER — NITROGLYCERIN 0.4 MG SL SUBL: 0.4000 mg | SUBLINGUAL_TABLET | SUBLINGUAL | Status: DC | PRN

## 2016-05-31 MED ORDER — ASPIRIN EC 81 MG PO TBEC
81.0000 mg | DELAYED_RELEASE_TABLET | Freq: Every day | ORAL | Status: DC
  Administered 2016-06-01: 81 mg via ORAL
  Filled 2016-05-31: qty 1

## 2016-05-31 MED ORDER — BENZONATATE 100 MG PO CAPS: 100.0000 mg | ORAL_CAPSULE | Freq: Three times a day (TID) | ORAL | Status: DC | PRN

## 2016-05-31 MED ORDER — POTASSIUM CHLORIDE CRYS ER 20 MEQ PO TBCR
20.0000 meq | EXTENDED_RELEASE_TABLET | Freq: Every day | ORAL | Status: DC
  Administered 2016-06-01: 20 meq via ORAL
  Filled 2016-05-31: qty 1

## 2016-05-31 MED ORDER — CLONAZEPAM 0.5 MG PO TABS
1.0000 mg | ORAL_TABLET | Freq: Every day | ORAL | Status: DC
  Administered 2016-05-31: 1 mg via ORAL
  Filled 2016-05-31: qty 2

## 2016-05-31 MED ORDER — LISINOPRIL 20 MG PO TABS
30.0000 mg | ORAL_TABLET | Freq: Every day | ORAL | Status: DC
  Administered 2016-06-01: 30 mg via ORAL
  Filled 2016-05-31: qty 1

## 2016-05-31 MED ORDER — CARVEDILOL 12.5 MG PO TABS
12.5000 mg | ORAL_TABLET | Freq: Two times a day (BID) | ORAL | Status: DC
  Administered 2016-06-01 (×2): 12.5 mg via ORAL
  Filled 2016-05-31 (×2): qty 1

## 2016-05-31 MED ORDER — POTASSIUM CHLORIDE CRYS ER 20 MEQ PO TBCR
40.0000 meq | EXTENDED_RELEASE_TABLET | Freq: Once | ORAL | Status: AC
  Administered 2016-05-31: 40 meq via ORAL
  Filled 2016-05-31: qty 2

## 2016-05-31 MED ORDER — PRAVASTATIN SODIUM 40 MG PO TABS
40.0000 mg | ORAL_TABLET | Freq: Every day | ORAL | Status: DC
  Administered 2016-05-31 – 2016-06-01 (×2): 40 mg via ORAL
  Filled 2016-05-31 (×2): qty 1

## 2016-05-31 MED ORDER — FUROSEMIDE 20 MG PO TABS
20.0000 mg | ORAL_TABLET | Freq: Every morning | ORAL | Status: DC
  Administered 2016-06-01: 20 mg via ORAL
  Filled 2016-05-31: qty 1

## 2016-05-31 MED ORDER — INSULIN ASPART 100 UNIT/ML ~~LOC~~ SOLN
0.0000 [IU] | Freq: Three times a day (TID) | SUBCUTANEOUS | Status: DC
  Administered 2016-06-01: 1 [IU] via SUBCUTANEOUS
  Administered 2016-06-01: 2 [IU] via SUBCUTANEOUS

## 2016-05-31 NOTE — Progress Notes (Signed)
1852 transferred in from ED via bed  Fully awake alerted and confused . Follow commands . No skin breakdown . Daughter in attendance . Supportive and assistive with pt's need

## 2016-05-31 NOTE — H&P (Signed)
H&P   Primary Cardiologist Dr Diona Browner  HPI:   Pleasant but demented 82 y/o female followed by Dr Diona Browner. She has been living in [facility] for the past 8 months. She has a PMH of chronic diastolic CHF, prior St Jude pacemaker for heart block, and HTN.  Her last echo was in in April 2015 and showed her EF to be 55-60% with grade 1 diastolic dysfunction and mild LVH. She was in her usual state of health till last evening. She apparently was not wearing her nighttime O2 and developed SOB. She was sent to [facility] where initially she was felt to be a STEMI but her Troponin is negative and her EKG changes are most likely secondary to her paced rhythm. She was transferred to [facility] and currently is improved but on O2 and her B/P is elevated.   PMHx:  Past Medical History  Diagnosis Date  . Chronic diastolic heart failure   . Essential hypertension, benign   . Type 2 diabetes mellitus (HCC)   . Mixed hyperlipidemia   . Osteoarthritis   . Gout   . Pulmonary hypertension (HCC)     Moderate - secondary to diastolic dysfunction  . Panic attacks   . Symptomatic advanced heart block     Status post pacemaker placement    Past Surgical History  Procedure Laterality Date  . Pacemaker insertion  04/19/07    SJM Zephyr XL DR implanted by Dr Juanda Chance for syncope and mobitz II AV block  . Ovarian cyst removal    . Breast biopsy    . Hemorrhoid surgery    . Abdominal hysterectomy      SOCHx:  reports that she has never smoked. She has never used smokeless tobacco. She reports that she does not drink alcohol or use illicit drugs.  FAMHx: No family history of CAD   ALLERGIES: No Known Allergies  ROS: Review of Systems: Unobtainable to obtain from patient secondary to dementia but I spoke with her son who reports she has been doing pretty well at [facility].    HOME MEDICATIONS: Prior to Admission medications   Medication Sig Start Date End Date Taking?  Authorizing Provider  acetaminophen (TYLENOL) 500 MG tablet Take 500 mg by mouth 2 (two) times daily.    Yes Historical Provider, MD  aspirin 81 MG tablet Take 81 mg by mouth daily.     Yes Historical Provider, MD  benzonatate (TESSALON) 100 MG capsule Take 100 mg by mouth every 8 (eight) hours as needed for cough.   Yes Historical Provider, MD  carvedilol (COREG) 6.25 MG tablet Take 1 tablet (6.25 mg total) by mouth 2 (two) times daily with a meal. Patient taking differently: Take 12.5 mg by mouth 2 (two) times daily with a meal.  04/08/14  Yes Hillis Range, MD  Cholecalciferol (VITAMIN D-3) 1000 UNITS CAPS Take 1 capsule by mouth daily.   Yes Historical Provider, MD  clonazePAM (KLONOPIN) 1 MG tablet Take 1 mg by mouth at bedtime.   Yes Historical Provider, MD  donepezil (ARICEPT) 10 MG tablet Take 10 mg by mouth at bedtime.   Yes Historical Provider, MD  furosemide (LASIX) 20 MG tablet Take 20 mg by mouth every morning.    Yes Historical Provider, MD  lisinopril (PRINIVIL,ZESTRIL) 20 MG tablet Take 1.5 tablets (30 mg total) by mouth daily. 09/18/15  Yes Jonelle Sidle, MD  LORazepam (ATIVAN) 0.5 MG tablet Take 0.5 mg by mouth every  12 (twelve) hours as needed. For anxiety 09/12/15  Yes Historical Provider, MD  metFORMIN (GLUCOPHAGE) 500 MG tablet Take 500-1,000 mg by mouth daily. Takes 2 in AM and 1 in PM   Yes Historical Provider, MD  Multiple Vitamin (MULTIVITAMIN) tablet Take 1 tablet by mouth daily.   Yes Historical Provider, MD  OXYGEN Inhale 2 L into the lungs daily.   Yes Historical Provider, MD  Potassium Chloride CR (MICRO-K) 8 MEQ CPCR capsule CR Take 2 capsules by mouth 2 (two) times daily. 08/21/15  Yes Historical Provider, MD  pravastatin (PRAVACHOL) 40 MG tablet Take 40 mg by mouth daily.     Yes Historical Provider, MD    HOSPITAL MEDICATIONS: I have reviewed the patient's current medications.  VITALS: Blood pressure 153/98, pulse 49, temperature 99 F (37.2 C), temperature  source Oral, resp. rate 20, SpO2 97 %.  PHYSICAL EXAM: General appearance: alert, cooperative, no distress and HOH Neck: no carotid bruit and no JVD Lungs: decreased at bases Heart: regular rate and rhythm Abdomen: soft, non-tender; bowel sounds normal; no masses,  no organomegaly Extremities: extremities normal, atraumatic, no cyanosis or edema Pulses: 2+ and symmetric Skin: Skin color, texture, turgor normal. No rashes or lesions Neurologic: Grossly normal  LABS: Results for orders placed or performed during the hospital encounter of 05/31/16 (from the past 24 hour(s))  Brain natriuretic peptide     Status: Abnormal   Collection Time: 05/31/16 11:30 AM  Result Value Ref Range   B Natriuretic Peptide 148.2 (H) 0.0 - 100.0 pg/mL  Comprehensive metabolic panel     Status: Abnormal   Collection Time: 05/31/16 11:38 AM  Result Value Ref Range   Sodium 139 135 - 145 mmol/L   Potassium 3.7 3.5 - 5.1 mmol/L   Chloride 103 101 - 111 mmol/L   CO2 32 22 - 32 mmol/L   Glucose, Bld 153 (H) 65 - 99 mg/dL   BUN 12 6 - 20 mg/dL   Creatinine, Ser 9.56 0.44 - 1.00 mg/dL   Calcium 9.0 8.9 - 21.3 mg/dL   Total Protein 6.2 (L) 6.5 - 8.1 g/dL   Albumin 3.5 3.5 - 5.0 g/dL   AST 14 (L) 15 - 41 U/L   ALT 12 (L) 14 - 54 U/L   Alkaline Phosphatase 40 38 - 126 U/L   Total Bilirubin 0.4 0.3 - 1.2 mg/dL   GFR calc non Af Amer >60 >60 mL/min   GFR calc Af Amer >60 >60 mL/min   Anion gap 4 (L) 5 - 15  CBC with Differential     Status: Abnormal   Collection Time: 05/31/16 11:38 AM  Result Value Ref Range   WBC 6.9 4.0 - 10.5 K/uL   RBC 3.95 3.87 - 5.11 MIL/uL   Hemoglobin 11.5 (L) 12.0 - 15.0 g/dL   HCT 08.6 (L) 57.8 - 46.9 %   MCV 89.4 78.0 - 100.0 fL   MCH 29.1 26.0 - 34.0 pg   MCHC 32.6 30.0 - 36.0 g/dL   RDW 62.9 52.8 - 41.3 %   Platelets 174 150 - 400 K/uL   Neutrophils Relative % 61 %   Neutro Abs 4.2 1.7 - 7.7 K/uL   Lymphocytes Relative 27 %   Lymphs Abs 1.9 0.7 - 4.0 K/uL   Monocytes  Relative 8 %   Monocytes Absolute 0.6 0.1 - 1.0 K/uL   Eosinophils Relative 4 %   Eosinophils Absolute 0.2 0.0 - 0.7 K/uL   Basophils Relative 0 %  Basophils Absolute 0.0 0.0 - 0.1 K/uL  I-stat troponin, ED     Status: None   Collection Time: 05/31/16 12:03 PM  Result Value Ref Range   Troponin i, poc 0.00 0.00 - 0.08 ng/mL   Comment 3            EKG: Paced-55  IMAGING: Dg Chest 2 View  05/31/2016  CLINICAL DATA:  Shortness of breath this morning. Left lower chest pain. EXAM: CHEST  2 VIEW COMPARISON:  PA and lateral chest 09/01/2012. FINDINGS: Pacing device is in place. There is cardiomegaly but no edema. The lungs are clear. No pneumothorax or pleural effusion. Thoracic spondylosis is noted. IMPRESSION: Cardiomegaly without acute disease. Electronically Signed   By: Drusilla Kannerhomas  Dalessio M.D.   On: 05/31/2016 12:44    IMPRESSION: Principal Problem:   Acute on chronic diastolic heart failure (HCC) Active Problems:   Uncontrolled hypertension   Chronic diastolic heart failure (HCC)   PPM-St.Jude   Dyslipidemia   Dementia  Plan: Despite low BNP I think she probably had acute on chronic diastolic CHF secondary to uncontrolled HTN. Plan to admit and Rx with one dose of IV Lasix and add Ca++ blocker for HTN. I also ordered a D-dimer and will cycle Troponin's. MD to see.    Time Spent Directly with Patient: 45 minutes  Corine ShelterLuke Kilroy, [place]  [phone] beeper 05/31/2016, 2:56 PM   I have examined the patient and reviewed assessment and plan and discussed with patient.  Agree with above as stated.  R/o for MI.  Diurese with Lasix.  Known diastolic heart failure. BP control. Patient with dementia.  SPoke to son at length.  It is difficult to get an accurate history from her.   Lance MussJayadeep Bricelyn Freestone

## 2016-05-31 NOTE — ED Provider Notes (Signed)
CSN: [id number]     Arrival date & time 05/31/16  1128 History   First MD Initiated Contact with Patient 05/31/16 1145     Chief Complaint  Patient presents with  . Shortness of Breath  . Chest Pain     (Consider location/radiation/quality/duration/timing/severity/associated sxs/prior Treatment) HPI   Level V caveat applies due to dementia.  [name] [name] is a 82 y.o. female, with a history of Heart failure, DM, pulmonary hypertension, and hypertension, presenting to the ED with shortness of breath and chest pain that occurred last night when she was not wearing her prescribed oxygen. Pt was able to sleep, but woke up this morning with increased shortness of breath. Patient was first seen at [facility], but then transferred here for possible STEMI. Patient currently admits to some shortness of breath, but denies chest pain. Patient denies nausea/vomiting, fever/chills, falls or trauma, or any other complaints.       Past Medical History  Diagnosis Date  . Chronic diastolic heart failure   . Essential hypertension, benign   . Type 2 diabetes mellitus (HCC)   . Mixed hyperlipidemia   . Osteoarthritis   . Gout   . Pulmonary hypertension (HCC)     Moderate - secondary to diastolic dysfunction  . Panic attacks   . Symptomatic advanced heart block     Status post pacemaker placement   Past Surgical History  Procedure Laterality Date  . Pacemaker insertion  04/19/07    SJM Zephyr XL DR implanted by Dr Juanda Chance for syncope and mobitz II AV block  . Ovarian cyst removal    . Breast biopsy    . Hemorrhoid surgery    . Abdominal hysterectomy     History reviewed. No pertinent family history. Social History  Substance Use Topics  . Smoking status: Never Smoker   . Smokeless tobacco: Never Used  . Alcohol Use: No   OB History    No data available     Review of Systems  Unable to perform ROS: Dementia  Respiratory: Positive for shortness of breath.   Cardiovascular:  Positive for chest pain.      Allergies  Review of patient's allergies indicates no known allergies.  Home Medications   Prior to Admission medications   Medication Sig Start Date End Date Taking? Authorizing Provider  acetaminophen (TYLENOL) 500 MG tablet Take 500 mg by mouth 2 (two) times daily.     Historical Provider, MD  aspirin 81 MG tablet Take 81 mg by mouth daily.      Historical Provider, MD  carvedilol (COREG) 6.25 MG tablet Take 1 tablet (6.25 mg total) by mouth 2 (two) times daily with a meal. Patient taking differently: Take 12.5 mg by mouth 2 (two) times daily with a meal.  04/08/14   Hillis Range, MD  Cholecalciferol (VITAMIN D-3) 1000 UNITS CAPS Take 1 capsule by mouth daily.    Historical Provider, MD  clonazePAM (KLONOPIN) 1 MG tablet Take 1 mg by mouth at bedtime.    Historical Provider, MD  donepezil (ARICEPT) 10 MG tablet Take 10 mg by mouth at bedtime.    Historical Provider, MD  furosemide (LASIX) 20 MG tablet Take 20 mg by mouth every morning.     Historical Provider, MD  lisinopril (PRINIVIL,ZESTRIL) 20 MG tablet Take 1.5 tablets (30 mg total) by mouth daily. 09/18/15   Jonelle Sidle, MD  LORazepam (ATIVAN) 0.5 MG tablet Take 0.5 mg by mouth every 12 (twelve) hours as needed. For  anxiety 09/12/15   Historical Provider, MD  metFORMIN (GLUCOPHAGE) 500 MG tablet Take 500 mg by mouth 3 (three) times daily. Takes 2 in AM and 1 in PM    Historical Provider, MD  Multiple Vitamin (MULTIVITAMIN) tablet Take 1 tablet by mouth daily.    Historical Provider, MD  Potassium Chloride CR (MICRO-K) 8 MEQ CPCR capsule CR Take 2 capsules by mouth 2 (two) times daily. 08/21/15   Historical Provider, MD  pravastatin (PRAVACHOL) 40 MG tablet Take 40 mg by mouth daily.      Historical Provider, MD  tiZANidine (ZANAFLEX) 4 MG capsule Take 4 mg by mouth 2 (two) times daily.    Historical Provider, MD  vitamin C (ASCORBIC ACID) 500 MG tablet Take 500 mg by mouth daily.    Historical  Provider, MD   BP 173/73 mmHg  Pulse 50  Temp(Src) 99 F (37.2 C) (Oral)  Resp 18  SpO2 97% Physical Exam  Constitutional: She appears well-developed and well-nourished. No distress.  HENT:  Head: Normocephalic and atraumatic.  Eyes: Conjunctivae are normal.  Neck: Neck supple.  Cardiovascular: Normal rate, regular rhythm, normal heart sounds and intact distal pulses.   Pulmonary/Chest: She has decreased breath sounds in the right lower field and the left lower field.  Increased work of breathing. Patient speaks in phrases, but needs to catch her breath in between.  Abdominal: Soft. There is no tenderness. There is no guarding.  Musculoskeletal: She exhibits no edema or tenderness.  Lymphadenopathy:    She has no cervical adenopathy.  Neurological: She is alert.  Patient is oriented to self only.  Skin: Skin is warm and dry. She is not diaphoretic.  Psychiatric: She has a normal mood and affect. Her behavior is normal.  Nursing note and vitals reviewed.   ED Course  Procedures (including critical care time) Labs Review Labs Reviewed  COMPREHENSIVE METABOLIC PANEL - Abnormal; Notable for the following:    Glucose, Bld 153 (*)    Total Protein 6.2 (*)    AST 14 (*)    ALT 12 (*)    Anion gap 4 (*)    All other components within normal limits  CBC WITH DIFFERENTIAL/PLATELET - Abnormal; Notable for the following:    Hemoglobin 11.5 (*)    HCT 35.3 (*)    All other components within normal limits  BRAIN NATRIURETIC PEPTIDE - Abnormal; Notable for the following:    B Natriuretic Peptide 148.2 (*)    All other components within normal limits  URINALYSIS, ROUTINE W REFLEX MICROSCOPIC (NOT AT [facility])  Rosezena Sensor, ED    Imaging Review Dg Chest 2 View  05/31/2016  CLINICAL DATA:  Shortness of breath this morning. Left lower chest pain. EXAM: CHEST  2 VIEW COMPARISON:  PA and lateral chest 09/01/2012. FINDINGS: Pacing device is in place. There is cardiomegaly but no edema.  The lungs are clear. No pneumothorax or pleural effusion. Thoracic spondylosis is noted. IMPRESSION: Cardiomegaly without acute disease. Electronically Signed   By: Drusilla Kanner M.D.   On: 05/31/2016 12:44   I have personally reviewed and evaluated these images and lab results as part of my medical decision-making.   EKG Interpretation   Date/Time:  Monday May 31 2016 11:42:44 EDT Ventricular Rate:  51 PR Interval:  315 QRS Duration: 169 QT Interval:  473 QTC Calculation: 436 R Axis:   -82 Text Interpretation:  Electronic ventricular pacemaker Confirmed by Denton Lank   MD, Caryn Bee ([id number]) on 05/31/2016 11:59:55 AM  MDM   Final diagnoses:  Shortness of breath  Chest pain, unspecified chest pain type    Neita GarnetMaggie W Morriss presents with chest pain and shortness of breath that may have begun last night.  Findings and plan of care discussed with Cathren LaineKevin Steinl, MD. Dr. Denton LankSteinl personally evaluated and examined this patient.  Patient was transferred from [facility] via EMS due to concern for STEMI. This patient was very difficult to assess due to a combination of patient being hard of hearing and some possible dementia. Inability to confirm STEMI on EKG due to the patient having a paced rhythm. STEMI team assessed patient and cancelled the STEMI activation. Dr. Swazil[place]Jordan, Cardiologist, was the contact for the transfer. HEART score is 4, indicating moderate risk for a cardiac event. Wells criteria score is 0, indicating low risk for PE.  1:24 PM Spoke with Trish from cardiology, who agreed to consult on the patient. 2:06 PM Spoke with Dr. Caroleen Hammanumley, Family Medicine resident, who agreed to admit the patient to telemetry observation under attending Dr. Burnard LeighSarah Neal.  Filed Vitals:   05/31/16 1200 05/31/16 1330 05/31/16 1345 05/31/16 1400  BP: 170/71 103/91 115/88 153/98  Pulse: 49 50 52 49  Temp:      TempSrc:      Resp: 23 12 17 20   SpO2: 94% 96% 95% 97%     Anselm PancoastShawn C Chinita Schimpf, PA-C 05/31/16  1412  Cathren LaineKevin Steinl, MD 05/31/16 1556

## 2016-05-31 NOTE — ED Notes (Signed)
Pt to ER [facility] due to concern for STEMI. Pt rhythm is paced. Complaining of shortness of breath and chest pain onset last night. Pt reports relief of chest pain with nitropaste that's present to right chest. Pt has hx of CHF. Wears daily O2 at 2L. Alert to self. EMS reports hx of dementia. VSS.

## 2016-05-31 NOTE — Care Management Note (Signed)
Case Management Note  Patient Details  Name: Neita GarnetMaggie W Lease MRN: [id number] Date of Birth: Jun 01, 1927  Subjective/Objective:                  82 y/o female followed by Dr Diona BrownerMcDowell. She has been living in [facility] for the past 8 months.  Action/Plan: Follow for disposition needs.   Expected Discharge Date:  06/02/16               Expected Discharge Plan:  Assisted Living / Rest Home  In-House Referral:  Clinical Social Work  Discharge planning Services     Post Acute Care Choice:    Choice offered to:     DME Arranged:    DME Agency:     HH Arranged:    HH Agency:     Status of Service:  In process, will continue to follow  Medicare Important Message Given:    Date Medicare IM Given:    Medicare IM give by:    Date Additional Medicare IM Given:    Additional Medicare Important Message give by:     If discussed at Long Length of Stay Meetings, dates discussed:    Additional Comments:  Oletta CohnWood, Marshell Rieger, RN 05/31/2016, 3:41 PM

## 2016-06-01 DIAGNOSIS — I5032 Chronic diastolic (congestive) heart failure: Secondary | ICD-10-CM

## 2016-06-01 DIAGNOSIS — F0390 Unspecified dementia without behavioral disturbance: Secondary | ICD-10-CM | POA: Diagnosis not present

## 2016-06-01 DIAGNOSIS — I110 Hypertensive heart disease with heart failure: Secondary | ICD-10-CM | POA: Diagnosis not present

## 2016-06-01 DIAGNOSIS — I5033 Acute on chronic diastolic (congestive) heart failure: Secondary | ICD-10-CM | POA: Diagnosis not present

## 2016-06-01 DIAGNOSIS — Z950 Presence of cardiac pacemaker: Secondary | ICD-10-CM | POA: Diagnosis not present

## 2016-06-01 LAB — GLUCOSE, CAPILLARY
GLUCOSE-CAPILLARY: 142 mg/dL — AB (ref 65–99)
Glucose-Capillary: 131 mg/dL — ABNORMAL HIGH (ref 65–99)
Glucose-Capillary: 121 mg/dL — ABNORMAL HIGH (ref 65–99)

## 2016-06-01 LAB — BASIC METABOLIC PANEL
Anion gap: 9 (ref 5–15)
BUN: 10 mg/dL (ref 6–20)
CO2: 30 mmol/L (ref 22–32)
Calcium: 9.1 mg/dL (ref 8.9–10.3)
Chloride: 103 mmol/L (ref 101–111)
Creatinine, Ser: 0.66 mg/dL (ref 0.44–1.00)
GFR calc Af Amer: >60 mL/min (ref >60)
GFR calc non Af Amer: >60 mL/min (ref >60)
Glucose, Bld: 126 mg/dL — ABNORMAL HIGH (ref 65–99)
Potassium: 3.5 mmol/L (ref 3.5–5.1)
Sodium: 142 mmol/L (ref 135–145)

## 2016-06-01 LAB — MRSA PCR SCREENING: MRSA BY PCR: NEGATIVE

## 2016-06-01 LAB — TROPONIN I

## 2016-06-01 MED ORDER — ASPIRIN 81 MG PO TBEC: 81.0000 mg | DELAYED_RELEASE_TABLET | Freq: Every day | ORAL | Status: DC

## 2016-06-01 MED ORDER — CARVEDILOL 6.25 MG PO TABS: 12.5000 mg | ORAL_TABLET | Freq: Two times a day (BID) | ORAL | Status: DC

## 2016-06-01 MED ORDER — AMLODIPINE BESYLATE 5 MG PO TABS: 5.0000 mg | ORAL_TABLET | Freq: Every day | ORAL | Status: DC

## 2016-06-01 NOTE — Progress Notes (Signed)
DISCHARGE INSTRUCTIONS GIVEN TO SON VERBALIZED UNDERSTANDING. [facility] WITH SON . wHEELED TO LOBBY BY NT

## 2016-06-01 NOTE — Clinical Social Work Note (Signed)
CSW facilitated patient discharge including contacting patient family and facility to confirm patient discharge plans. Clinical information faxed to facility and family agreeable with plan. Patient's son will transport back to [facility] [place] (Corrected from earlier). RN to call report prior to discharge [phone]).  CSW will sign off for now as social work intervention is no longer needed. Please consult us again if new needs arise.  Charlynn CourtSarah Hafiz Irion, CSW [phone]

## 2016-06-01 NOTE — Progress Notes (Signed)
Patient Name: [name] [name] Date of Encounter: 06/01/2016  Primary Cardiologist: Dr. Diona Browner   Principal Problem:   Acute on chronic diastolic heart failure [facility]) Active Problems:   Uncontrolled hypertension   Chronic diastolic heart failure (HCC)   PPM-St.Jude   Dyslipidemia   Dementia    SUBJECTIVE  No CP, no discomfort. Otherwise, could not given meaning answers due to significant dementia. Does not know where she is. Son at bedside. Per Son, this is her baseline, also she looks better than yesterday  CURRENT MEDS . amLODipine  5 mg Oral Daily  . aspirin EC  81 mg Oral Daily  . carvedilol  12.5 mg Oral BID WC  . clonazePAM  1 mg Oral QHS  . donepezil  10 mg Oral QHS  . furosemide  20 mg Oral q morning - 10a  . heparin  5,000 Units Subcutaneous Q8H  . insulin aspart  0-5 Units Subcutaneous QHS  . insulin aspart  0-9 Units Subcutaneous TID WC  . lisinopril  30 mg Oral Daily  . metFORMIN  500 mg Oral BID WC  . multivitamin with minerals  1 tablet Oral Daily  . potassium chloride  20 mEq Oral Daily  . pravastatin  40 mg Oral Daily  . sodium chloride flush  3 mL Intravenous Q12H    OBJECTIVE  Filed Vitals:   05/31/16 1903 05/31/16 2223 06/01/16 0542 06/01/16 1221  BP: 177/60 147/69 138/112 134/60  Pulse: 50 55 60 50  Temp: 98.3 F (36.8 C) 97.7 F (36.5 C) 98.2 F (36.8 C) 97.7 F (36.5 C)  TempSrc: Oral Oral Oral Oral  Resp: Height:  (1.575 m)     Weight: 171 lb 6.4 oz (77.747 kg)  166 lb 14.2 oz (75.7 kg)   SpO2: 95% 93% 97% 96%    Intake/Output Summary (Last 24 hours) at 06/01/16 1435 Last data filed at 06/01/16 1320  Gross per 24 hour  Intake    580 ml  Output    100 ml  Net    480 ml   Filed Weights   05/31/16 1903 06/01/16 0542  Weight: 171 lb 6.4 oz (77.747 kg) 166 lb 14.2 oz (75.7 kg)    PHYSICAL EXAM  General: Pleasant, NAD. Neuro: Alert and oriented X 3. Moves all extremities spontaneously. Psych: Normal  affect. HEENT:  Normal  Neck: Supple without bruits or JVD. Lungs:  Resp regular and unlabored, CTA. Heart: RRR no s3, s4, or murmurs. Abdomen: Soft, non-tender, non-distended, BS + x 4.  Extremities: No clubbing, cyanosis or edema. DP/PT/Radials 2+ and equal bilaterally.  Accessory Clinical Findings  CBC  Recent Labs  05/31/16 1138  WBC 6.9  NEUTROABS 4.2  HGB 11.5*  HCT 35.3*  MCV 89.4  PLT 174   Basic Metabolic Panel  Recent Labs  05/31/16 1138 06/01/16 0431  NA 139 142  K 3.7 3.5  CL 103 103  CO2 32 30  GLUCOSE 153* 126*  BUN 12 10  CREATININE 0.71 0.66  CALCIUM 9.0 9.1   Liver Function Tests  Recent Labs  05/31/16 1138  AST 14*  ALT 12*  ALKPHOS 40  BILITOT 0.4  PROT 6.2*  ALBUMIN 3.5   Cardiac Enzymes  Recent Labs  05/31/16 1538 06/01/16 0431  TROPONINI <0.03 <0.03   BNP Invalid input(s): POCBNP D-Dimer  Recent Labs  05/31/16 1538  DDIMER 0.35   TELE Paced rhythm with HR 50s    ECG  No  new EKG  Echocardiogram 04/24/2014  LV EF: 55% -  60%  ------------------------------------------------------------ History:  PMH: Pacer, cardiomyopathy, Echo in 09-11 showed 55-60% EF with mild MR TR Risk factors: Hypertension. Dyslipidemia.  ------------------------------------------------------------ Study Conclusions  - Study data: Technically difficult study. - Left ventricle: The cavity size was normal. Wall thickness was increased in a pattern of mild LVH. Systolic function was normal. The estimated ejection fraction was in the range of 55% to 60%. Doppler parameters are consistent with abnormal left ventricular relaxation (grade 1 diastolic dysfunction). - Aortic valve: Technically difficult study, continous wave Doppler of the AV was not able to be obtained. Grossly there is no significant stenosis or regurgitation. - Mitral valve: Mildly calcified annulus. Mildly thickened leaflets . Mild  regurgitation. - Left atrium: The atrium was mildly dilated.    Radiology/Studies  Dg Chest 2 View  05/31/2016  CLINICAL DATA:  Shortness of breath this morning. Left lower chest pain. EXAM: CHEST  2 VIEW COMPARISON:  PA and lateral chest 09/01/2012. FINDINGS: Pacing device is in place. There is cardiomegaly but no edema. The lungs are clear. No pneumothorax or pleural effusion. Thoracic spondylosis is noted. IMPRESSION: Cardiomegaly without acute disease. Electronically Signed   By: Drusilla Kannerhomas  Dalessio M.D.   On: 05/31/2016 12:44    ASSESSMENT AND PLAN  1. Acute on chronic diastolic HF  - initially transferred as STEMI, but noted to be in paced rhythm instead  - received 1 dose IV lasix yesterday, currently appears to be euvolemic   - d-dimer negative. stable for discharge today  2. H/o CHB s/p St Jude PPM  3. HTN  Signed, Azalee CourseHao Meng PA-C Pager: [phone]   I have examined the patient and reviewed assessment and plan and discussed with patient.  Agree with above as stated.  Dyspnea resolved.  Son feels that the patient is back to baseline.  She is demented and it is difficult to get a good history from her. Plan discharge back to facility today.  Lance MussJayadeep Rendon Howell

## 2016-06-01 NOTE — Discharge Summary (Signed)
Discharge Summary    Patient ID: [name] [name],  MRN: [id number], DOB/AGE: 04-06-1927 82 y.o.  Admit date: 05/31/2016 Discharge date: 06/01/2016  Primary Care Provider: TAPPER,DAVID B Primary Cardiologist: Dr. Diona BrownerMcDowell  Discharge Diagnoses    Principal Problem:   Acute on chronic diastolic heart failure (HCC) Active Problems:   Uncontrolled hypertension   Chronic diastolic heart failure (HCC)   PPM-St.Jude   Dyslipidemia   Dementia   Allergies No Known Allergies  Diagnostic Studies/Procedures    None _____________   History of Present Illness     Pleasant but demented 82 y/o female followed by Dr Diona BrownerMcDowell. She has [facility] for the past 8 months. She has a PMH of chronic diastolic CHF, prior St Jude pacemaker for heart block, and HTN. Her last echo was in in April 2015 and showed her EF to be 55-60% with grade 1 diastolic dysfunction and mild LVH. She was in her usual state of health till last evening. She apparently was not wearing her nighttime O2 and developed SOB. [facility] where initially she was felt to be a STEMI but her Troponin is negative and her EKG changes are most likely secondary to her paced rhythm. She was [facility] and currently is improved but on O2 and her B/P is elevated.   Hospital Course     She was hypertensive on arrival, amlodipine 5 mg was added to her medical regimen. She was also felt to be fluid overloaded on exam, she was given one dose of IV Lasix with good response. She was seen on the following day on 06/01/2016, at which time she denies any significant shortness of breath or chest discomfort. D-dimer was negative. On physical exam she appears to be euvolemic. She has been put back on her home dose of Lasix. Otherwise she is deemed stable for discharge back to nursing facility.  _____________  Discharge Vitals Blood pressure 134/60, pulse 50, temperature 97.7 F (36.5 C),  temperature source Oral, resp. rate 20, height 5\' 2"  (1.575 m), weight 166 lb 14.2 oz (75.7 kg), SpO2 96 %.  Filed Weights   05/31/16 1903 06/01/16 0542  Weight: 171 lb 6.4 oz (77.747 kg) 166 lb 14.2 oz (75.7 kg)    Labs & Radiologic Studies     CBC  Recent Labs  05/31/16 1138  WBC 6.9  NEUTROABS 4.2  HGB 11.5*  HCT 35.3*  MCV 89.4  PLT 174   Basic Metabolic Panel  Recent Labs  05/31/16 1138 06/01/16 0431  NA 139 142  K 3.7 3.5  CL 103 103  CO2 32 30  GLUCOSE 153* 126*  BUN 12 10  CREATININE 0.71 0.66  CALCIUM 9.0 9.1   Liver Function Tests  Recent Labs  05/31/16 1138  AST 14*  ALT 12*  ALKPHOS 40  BILITOT 0.4  PROT 6.2*  ALBUMIN 3.5   Cardiac Enzymes  Recent Labs  05/31/16 1538 06/01/16 0431  TROPONINI <0.03 <0.03   D-Dimer  Recent Labs  05/31/16 1538  DDIMER 0.35    Dg Chest 2 View  05/31/2016  CLINICAL DATA:  Shortness of breath this morning. Left lower chest pain. EXAM: CHEST  2 VIEW COMPARISON:  PA and lateral chest 09/01/2012. FINDINGS: Pacing device is in place. There is cardiomegaly but no edema. The lungs are clear. No pneumothorax or pleural effusion. Thoracic spondylosis is noted. IMPRESSION: Cardiomegaly without acute disease. Electronically Signed   By: Maisie Fushomas  Dalessio M.D.   On: 05/31/2016 12:44    Disposition   Pt is being discharged back to [facility] today in good condition.  Follow-up Plans & Appointments    Follow-up Information    Follow up with Nona Dell, MD On 06/15/2016.   Specialty:  Cardiology   Why:  11:20AM. Cardiology followup   Contact information:   [address] Cecille Aver [place] [address] [phone]       Schedule an appointment as soon as possible for a visit with TAPPER,DAVID B, MD.   Specialty:  Family Medicine   Contact information:   [address] Raeanne Gathers [place] [address] [phone]      Discharge Instructions    Diet - low sodium heart healthy    Complete  by:  As directed      Increase activity slowly    Complete by:  As directed            Discharge Medications   Current Discharge Medication List    START taking these medications   Details  amLODipine (NORVASC) 5 MG tablet Take 1 tablet (5 mg total) by mouth daily. Qty: 90 tablet, Refills: 3    aspirin EC 81 MG EC tablet Take 1 tablet (81 mg total) by mouth daily.      CONTINUE these medications which have CHANGED   Details  carvedilol (COREG) 6.25 MG tablet Take 2 tablets (12.5 mg total) by mouth 2 (two) times daily with a meal. Qty: 60 tablet, Refills: 6      CONTINUE these medications which have NOT CHANGED   Details  benzonatate (TESSALON) 100 MG capsule Take 100 mg by mouth every 8 (eight) hours as needed for cough.    Cholecalciferol (VITAMIN D-3) 1000 UNITS CAPS Take 1 capsule by mouth daily.    clonazePAM (KLONOPIN) 1 MG tablet Take 1 mg by mouth at bedtime.    donepezil (ARICEPT) 10 MG tablet Take 10 mg by mouth at bedtime.    furosemide (LASIX) 20 MG tablet Take 20 mg by mouth every morning.     lisinopril (PRINIVIL,ZESTRIL) 20 MG tablet Take 1.5 tablets (30 mg total) by mouth daily. Qty: 45 tablet, Refills: 6    LORazepam (ATIVAN) 0.5 MG tablet Take 0.5 mg by mouth every 12 (twelve) hours as needed. For anxiety    metFORMIN (GLUCOPHAGE) 500 MG tablet Take 500-1,000 mg by mouth daily. Takes 2 in AM and 1 in PM    Multiple Vitamin (MULTIVITAMIN) tablet Take 1 tablet by mouth daily.    OXYGEN Inhale 2 L into the lungs daily.    Potassium Chloride CR (MICRO-K) 8 MEQ CPCR capsule CR Take 2 capsules by mouth 2 (two) times daily.    pravastatin (PRAVACHOL) 40 MG tablet Take 40 mg by mouth daily.           Outstanding Labs/Studies   None  Duration of Discharge Encounter   Greater than 30 minutes including physician time.  Signed, Azalee Course PA-C 06/01/2016, 3:20 PM    I have examined the patient and reviewed assessment and plan and discussed with  patient.  Agree with above as stated.  Dyspnea resolved.  Son feels that the patient is back to baseline.  She is demented and it is difficult to get a good history from her. Plan discharge back to facility today.  Lance Muss

## 2016-06-01 NOTE — NC FL2 (Addendum)
[place] MEDICAID FL2 LEVEL OF CARE SCREENING TOOL     IDENTIFICATION  Patient Name: [name] [name] Birthdate: Jan 21, 1927 Sex: female Admission Date (Current Location): 05/31/2016  [facility] [place]  [place]   Facility and Address:  [facility] [place], [address], [place], [place] [address]      Provider Number: [id number]  Attending Physician Name and Address:  Corky CraftsJayadeep S Varanasi, MD  Relative Name and Phone Number:       Current Level of Care: Hospital Recommended Level of Care: Assisted Living Facility Prior Approval Number:    Date Approved/Denied:   PASRR Number:    Discharge Plan:  (ALF)    Current Diagnoses: Patient Active Problem List   Diagnosis Date Noted  . Dementia 05/31/2016  . Acute on chronic diastolic heart failure (HCC) 05/31/2016  . Acute on chronic diastolic (congestive) heart failure (HCC) 05/31/2016  . Second degree Mobitz II AV block 03/07/2013  . Dyslipidemia   . Mitral regurgitation   . Pulmonary hypertension (HCC)   . Uncontrolled hypertension 12/08/2009  . Chronic diastolic heart failure (HCC) 12/08/2009  . PPM-St.Jude 12/08/2009    Orientation RESPIRATION BLADDER Height & Weight        O2 (Nasal Canula 2 L) Incontinent Weight: 166 lb 14.2 oz (75.7 kg) (scale B) Height:  5\' 2"  (157.5 cm)  BEHAVIORAL SYMPTOMS/MOOD NEUROLOGICAL BOWEL NUTRITION STATUS   (Cognition: Impulsive)  (Dementia) Continent Diet (Heart healthy)  AMBULATORY STATUS COMMUNICATION OF NEEDS Skin   Limited Assist Verbally Normal                       Personal Care Assistance Level of Assistance  Bathing, Feeding, Dressing Bathing Assistance: Limited assistance Feeding assistance: Independent Dressing Assistance: Limited assistance     Functional Limitations Info  Sight, Hearing, Speech Sight Info: Adequate Hearing Info: Adequate Speech Info: Adequate    SPECIAL CARE FACTORS FREQUENCY  Blood pressure                     Contractures Contractures Info: Not present    Additional Factors Info  Code Status, Allergies Code Status Info: DNR Allergies Info: NKDA           Current Medications (06/01/2016):  This is the current hospital active medication list Current Facility-Administered Medications  Medication Dose Route Frequency Provider Last Rate Last Dose  . 0.9 %  sodium chloride infusion  250 mL Intravenous PRN Abelino DerrickLuke K Kilroy, PA-C      . acetaminophen (TYLENOL) tablet 650 mg  650 mg Oral Q4H PRN Abelino DerrickLuke K Kilroy, PA-C   650 mg at 05/31/16 2235  . amLODipine (NORVASC) tablet 5 mg  5 mg Oral Daily Abelino DerrickLuke K Kilroy, PA-C   5 mg at 06/01/16 1039  . aspirin EC tablet 81 mg  81 mg Oral Daily Abelino DerrickLuke K Kilroy, PA-C   81 mg at 06/01/16 1039  . benzonatate (TESSALON) capsule 100 mg  100 mg Oral Q8H PRN Abelino DerrickLuke K Kilroy, PA-C      . carvedilol (COREG) tablet 12.5 mg  12.5 mg Oral BID WC Eda PaschalLuke K Kilroy, PA-C   12.5 mg at 06/01/16 0900  . clonazePAM (KLONOPIN) tablet 1 mg  1 mg Oral QHS Eda PaschalLuke K Dodge CenterKilroy, PA-C   1 mg at 05/31/16 2243  . donepezil (ARICEPT) tablet 10 mg  10 mg Oral QHS Abelino DerrickLuke K Kilroy, PA-C   10 mg at 05/31/16 2235  . furosemide (LASIX)  tablet 20 mg  20 mg Oral q morning - 10a Abelino Derrick, PA-C   20 mg at 06/01/16 1039  . heparin injection 5,000 Units  5,000 Units Subcutaneous [address], PA-C   5,000 Units at 06/01/16 0551  . hydrALAZINE (APRESOLINE) injection 5 mg  5 mg Intravenous Q6H PRN Abelino Derrick, PA-C      . insulin aspart (novoLOG) injection 0-5 Units  0-5 Units Subcutaneous QHS Abelino Derrick, PA-C   0 Units at 05/31/16 2244  . insulin aspart (novoLOG) injection 0-9 Units  0-9 Units Subcutaneous TID WC Abelino Derrick, PA-C   2 Units at 06/01/16 [phone]  . lisinopril (PRINIVIL,ZESTRIL) tablet 30 mg  30 mg Oral Daily Abelino Derrick, PA-C   30 mg at 06/01/16 1039  . LORazepam (ATIVAN) tablet 0.5 mg  0.5 mg Oral Q12H PRN Abelino Derrick, PA-C   0.5 mg at 05/31/16 2235  . metFORMIN (GLUCOPHAGE)  tablet 500 mg  500 mg Oral BID WC Eda Paschal Kilroy, PA-C   500 mg at 06/01/16 0900  . multivitamin with minerals tablet 1 tablet  1 tablet Oral Daily Faye Ramsay Rumbarger, RPH   1 tablet at 06/01/16 1039  . nitroGLYCERIN (NITROSTAT) SL tablet 0.4 mg  0.4 mg Sublingual Q5 Min x 3 PRN Eda Paschal Kilroy, PA-C      . ondansetron [facility]) injection 4 mg  4 mg Intravenous Q6H PRN Eda Paschal Kilroy, PA-C      . potassium chloride SA (K-DUR,KLOR-CON) CR tablet 20 mEq  20 mEq Oral Daily Abelino Derrick, PA-C   20 mEq at 06/01/16 1040  . pravastatin (PRAVACHOL) tablet 40 mg  40 mg Oral Daily Abelino Derrick, PA-C   40 mg at 06/01/16 1039  . sodium chloride flush (NS) 0.9 % injection 3 mL  3 mL Intravenous Q12H Luke K Kilroy, PA-C   3 mL at 06/01/16 1042  . sodium chloride flush (NS) 0.9 % injection 3 mL  3 mL Intravenous PRN Abelino Derrick, PA-C         Discharge Medications: Please see discharge summary for a list of discharge medications.  Relevant Imaging Results:  Relevant Lab Results:   Additional Information SS#: [id number]  Margarito Liner, LCSW

## 2016-06-01 NOTE — Clinical Social Work Note (Signed)
Clinical Social Work Assessment  Patient Details  Name: [name] [name] MRN: [id number] Date of Birth: 20-Jun-1927  Date of referral:  05/31/16               Reason for consult:  Facility Placement, Discharge Planning                Permission sought to share information with:  Facility Medical sales representativeContact Representative, Family Supports Permission granted to share information::  Yes, Verbal Permission Granted  Name::     [name] NajjarLarry  Agency::  [facility] [place]  Relationship::  Son  Contact Information:  [phone]  Housing/Transportation Living arrangements for the past 2 months:  Assisted Living Facility Source of Information:  Adult Children, Medical Team Patient Interpreter Needed:  None Criminal Activity/Legal Involvement Pertinent to Current Situation/Hospitalization:  No - Comment as needed Significant Relationships:  Adult Children Lives with:  Facility Resident Do you feel safe going back to the place where you live?  Yes Need for family participation in patient care:  Yes (Comment)  Care giving concerns:  [facility] from [facility] [place].   Social Worker assessment / plan:  Patient disoriented. CSW called patient's son. CSW introduced role and explained that discharge planning would be discussed. Per son, patient has [facility] October [facility] November [facility]. Plan is to go back. Patient's son will transport. No further concerns. CSW encouraged patient's son to contact CSW as needed. CSW will continue to follow patient and her family for support and facilitate discharge back to ALF.  Employment status:  Retired Health and safety inspectornsurance information:  Medicare PT Recommendations:  Not assessed at this time Information / Referral to community resources:  Other (Comment Required) (None. [facility] will [facility] [place])  Patient/Family's Response to care:  Patient's son agreeable to return to ALF. Patient's son supportive and involved in patient's care. Patient's son  polite and appreciated social work intervention.  Patient/Family's Understanding of and Emotional Response to Diagnosis, Current Treatment, and Prognosis:  Patient's son knowledgeable of medical interventions and aware of discharge back to ALF.  Emotional Assessment Appearance:  Appears stated age Attitude/Demeanor/Rapport:  Unable to Assess (Spoke with son over phone) Affect (typically observed):  Unable to Assess (Spoke with son over the phone) Orientation:    Alcohol / Substance use:  Never Used Psych involvement (Current and /or in the community):  No (Comment)  Discharge Needs  Concerns to be addressed:  Care Coordination Readmission within the last 30 days:  No Current discharge risk:  Cognitively Impaired Barriers to Discharge:  No Barriers Identified   [name] LinerSarah C Harmani Neto, LCSW 06/01/2016, 12:06 PM

## 2016-06-15 ENCOUNTER — Encounter: Payer: Self-pay | Admitting: Cardiology

## 2016-06-15 ENCOUNTER — Ambulatory Visit (INDEPENDENT_AMBULATORY_CARE_PROVIDER_SITE_OTHER): Payer: Medicare Other | Admitting: Cardiology

## 2016-06-15 VITALS — BP 149/78 | HR 51 | Ht 64.0 in | Wt 181.0 lb

## 2016-06-15 DIAGNOSIS — I10 Essential (primary) hypertension: Secondary | ICD-10-CM | POA: Diagnosis not present

## 2016-06-15 DIAGNOSIS — I5032 Chronic diastolic (congestive) heart failure: Secondary | ICD-10-CM

## 2016-06-15 DIAGNOSIS — I441 Atrioventricular block, second degree: Secondary | ICD-10-CM | POA: Diagnosis not present

## 2016-06-15 NOTE — Patient Instructions (Signed)
Medication Instructions:   Your physician recommends that you continue on your current medications as directed. Please refer to the Current Medication list given to you today.  Labwork:  NONE  Testing/Procedures:  NONE  Follow-Up:  Your physician recommends that you schedule a follow-up appointment in: 4 months. You will receive a reminder letter in the mail in about 2 months reminding you to call and schedule your appointment. If you don't receive this letter, please contact our office.  Any Other Special Instructions Will Be Listed Below (If Applicable).  If you need a refill on your cardiac medications before your next appointment, please call your pharmacy. 

## 2016-06-15 NOTE — Progress Notes (Signed)
Cardiology Office Note  Date: 06/15/2016   ID: [name] [name], DOB March 04, 1927, MRN [id number]  PCP: Louie BostonAPPER,DAVID B, MD  Primary Cardiologist: Nona DellSamuel McDowell, MD   Chief Complaint  Patient presents with  . Hospitalization Follow-up    History of Present Illness: [name] [name] is an 82 y.o. female last seen in September 2016. Record review finds hospitalization at [facility] in early June with shortness of breath. This was complicated by hypoxia and not using her evening supplemental oxygen, thought to be possible STEMI based on evaluation in [place], however this was not the case on review. Cardiac enzymes were negative, she did have evidence of volume overload and was treated for acute on chronic diastolic heart failure. She resides at [facility].  She is here today with her son for a follow-up visit. We discussed the importance of her supplemental oxygen and regular use. She continues on regular diuretic therapy. No leg swelling. Her weight is up compared to discharge, but by different scale. She does not report any chest pain.  Past Medical History  Diagnosis Date  . Chronic diastolic heart failure   . Essential hypertension, benign   . Type 2 diabetes mellitus (HCC)   . Mixed hyperlipidemia   . Osteoarthritis   . Gout   . Pulmonary hypertension (HCC)     Moderate - secondary to diastolic dysfunction  . Panic attacks   . Symptomatic advanced heart block     Status post pacemaker placement    Past Surgical History  Procedure Laterality Date  . Pacemaker insertion  04/19/07    SJM Zephyr XL DR implanted by Dr Juanda ChanceBrodie for syncope and mobitz II AV block  . Ovarian cyst removal    . Breast biopsy    . Hemorrhoid surgery    . Abdominal hysterectomy      Current Outpatient Prescriptions  Medication Sig Dispense Refill  . acetaminophen (TYLENOL) 325 MG tablet Take 650 mg by mouth every morning. As needed    . acetaminophen (TYLENOL) 500 MG tablet Take 1,000  mg by mouth at bedtime as needed.    Marland Kitchen. amLODipine (NORVASC) 5 MG tablet Take 1 tablet (5 mg total) by mouth daily. 90 tablet 3  . aspirin EC 81 MG EC tablet Take 1 tablet (81 mg total) by mouth daily.    . benzonatate (TESSALON) 100 MG capsule Take 100 mg by mouth every 8 (eight) hours as needed for cough.    . carvedilol (COREG) 12.5 MG tablet Take 12.5 mg by mouth 2 (two) times daily.    . Cholecalciferol (VITAMIN D-3) 1000 UNITS CAPS Take 1 capsule by mouth daily.    . clonazePAM (KLONOPIN) 1 MG tablet Take 1 mg by mouth at bedtime.    . donepezil (ARICEPT) 10 MG tablet Take 10 mg by mouth at bedtime.    . furosemide (LASIX) 20 MG tablet Take 20 mg by mouth 2 (two) times daily.     Marland Kitchen. lisinopril (PRINIVIL,ZESTRIL) 20 MG tablet Take 1.5 tablets (30 mg total) by mouth daily. 45 tablet 6  . LORazepam (ATIVAN) 0.5 MG tablet Take 0.5 mg by mouth every 12 (twelve) hours as needed. For anxiety    . metFORMIN (GLUCOPHAGE) 500 MG tablet Take 500-1,000 mg by mouth daily. Takes 2 in AM and 1 in PM    . Multiple Vitamin (MULTIVITAMIN) tablet Take 1 tablet by mouth daily.    . OXYGEN Inhale 2 L into the lungs daily.    .Marland Kitchen  Potassium Chloride CR (MICRO-K) 8 MEQ CPCR capsule CR Take 2 capsules by mouth 2 (two) times daily.    . pravastatin (PRAVACHOL) 40 MG tablet Take 40 mg by mouth daily.       No current facility-administered medications for this visit.   Allergies:  Review of patient's allergies indicates no known allergies.   Social History: The patient  reports that she has never smoked. She has never used smokeless tobacco. She reports that she does not drink alcohol or use illicit drugs.   ROS:  Please see the history of present illness. Otherwise, complete review of systems is positive for diminished hearing.  All other systems are reviewed and negative.   Physical Exam: VS:  BP 149/78 mmHg  Pulse 51  Ht  (1.626 m)  Wt 181 lb (82.101 kg)  BMI 31.05 kg/m2  SpO2 95%, BMI Body mass index  is 31.05 kg/(m^2).  Wt Readings from Last 3 Encounters:  06/15/16 181 lb (82.101 kg)  06/01/16 166 lb 14.2 oz (75.7 kg)  09/18/15 174 lb 1.9 oz (78.98 kg)    Overweight woman, appears comfortable. Wearing oxygen.  HEENT: Conjunctiva and lids normal, oropharynx clear.  Neck: Supple, no elevated JVP or carotid bruits, no thyromegaly.  Lungs: Clear to auscultation, nonlabored breathing at rest.  Cardiac: Regular rate and rhythm, no S3, soft systolic murmur, no pericardial rub.  Abdomen: Soft, nontender, bowel sounds present.  Extremities: 1+ lower leg and ankle edema, distal pulses 1-2+.  Skin: Warm and dry.  Musculoskeletal: No kyphosis.  Neuropsychiatric: Alert and oriented x3, affect grossly appropriate. Hard of hearing.  ECG: I personally reviewed the tracing from 05/31/2016 showed sinus rhythm with ventricular pacing.  Recent Labwork: 05/31/2016: ALT 12*; AST 14*; B Natriuretic Peptide 148.2*; Hemoglobin 11.5*; Platelets 174 06/01/2016: BUN 10; Creatinine, Ser 0.66; Potassium 3.5; Sodium 142   Other Studies Reviewed Today:  Echocardiogram 04/24/2014: Study Conclusions  - Study data: Technically difficult study. - Left ventricle: The cavity size was normal. Wall thickness was increased in a pattern of mild LVH. Systolic function was normal. The estimated ejection fraction was in the range of 55% to 60%. Doppler parameters are consistent with abnormal left ventricular relaxation (grade 1 diastolic dysfunction). - Aortic valve: Technically difficult study, continous wave Doppler of the AV was not able to be obtained. Grossly there is no significant stenosis or regurgitation. - Mitral valve: Mildly calcified annulus. Mildly thickened leaflets . Mild regurgitation. - Left atrium: The atrium was mildly dilated.  Assessment and Plan:  1. Chronic diastolic heart failure. Relatively stable at this time. Would follow weight regularly at assisted living facility,  Lasix could be adjusted by PCP depending on need, otherwise maintain present standing dose at 20 mg twice daily. Additional therapy reviewed and includes Coreg, Norvasc, and lisinopril.   2. History of heart block status post St. Jude pacemaker placement. Followed by Dr. Johney Frame.  3. Essential hypertension, no changes made to current regimen.  Current medicines were reviewed with the patient today.  Disposition: Follow-up with me in 4 months.  Signed, Jonelle Sidle, MD, [facility] 06/15/2016 11:39 AM    [facility] at [facility] [address] [place], [place], [place] [address] Phone: [phone]; Fax: [phone]

## 2016-07-02 ENCOUNTER — Encounter: Payer: Medicare Other | Admitting: Internal Medicine

## 2016-07-18 ENCOUNTER — Emergency Department (HOSPITAL_COMMUNITY): Payer: Medicare Other

## 2016-07-18 ENCOUNTER — Emergency Department (HOSPITAL_COMMUNITY)
Admission: EM | Admit: 2016-07-18 | Discharge: 2016-07-18 | Disposition: A | Discharge status: Home / Self Care | Payer: Medicare Other | Attending: Emergency Medicine | Admitting: Emergency Medicine

## 2016-07-18 ENCOUNTER — Encounter (HOSPITAL_COMMUNITY): Payer: Self-pay | Admitting: Emergency Medicine

## 2016-07-18 DIAGNOSIS — Y929 Unspecified place or not applicable: Secondary | ICD-10-CM | POA: Insufficient documentation

## 2016-07-18 DIAGNOSIS — I110 Hypertensive heart disease with heart failure: Secondary | ICD-10-CM | POA: Diagnosis not present

## 2016-07-18 DIAGNOSIS — Z7984 Long term (current) use of oral hypoglycemic drugs: Secondary | ICD-10-CM | POA: Insufficient documentation

## 2016-07-18 DIAGNOSIS — I5033 Acute on chronic diastolic (congestive) heart failure: Secondary | ICD-10-CM | POA: Diagnosis not present

## 2016-07-18 DIAGNOSIS — Y999 Unspecified external cause status: Secondary | ICD-10-CM | POA: Diagnosis not present

## 2016-07-18 DIAGNOSIS — E119 Type 2 diabetes mellitus without complications: Secondary | ICD-10-CM | POA: Insufficient documentation

## 2016-07-18 DIAGNOSIS — F0390 Unspecified dementia without behavioral disturbance: Secondary | ICD-10-CM | POA: Insufficient documentation

## 2016-07-18 DIAGNOSIS — Z7982 Long term (current) use of aspirin: Secondary | ICD-10-CM | POA: Insufficient documentation

## 2016-07-18 DIAGNOSIS — M25512 Pain in left shoulder: Secondary | ICD-10-CM | POA: Diagnosis present

## 2016-07-18 DIAGNOSIS — T148 Other injury of unspecified body region: Secondary | ICD-10-CM | POA: Insufficient documentation

## 2016-07-18 DIAGNOSIS — R52 Pain, unspecified: Secondary | ICD-10-CM

## 2016-07-18 DIAGNOSIS — W06XXXA Fall from bed, initial encounter: Secondary | ICD-10-CM | POA: Insufficient documentation

## 2016-07-18 DIAGNOSIS — M25551 Pain in right hip: Secondary | ICD-10-CM | POA: Insufficient documentation

## 2016-07-18 DIAGNOSIS — Z79899 Other long term (current) drug therapy: Secondary | ICD-10-CM | POA: Diagnosis not present

## 2016-07-18 DIAGNOSIS — Y939 Activity, unspecified: Secondary | ICD-10-CM | POA: Diagnosis not present

## 2016-07-18 DIAGNOSIS — T148XXA Other injury of unspecified body region, initial encounter: Secondary | ICD-10-CM

## 2016-07-18 NOTE — ED Provider Notes (Signed)
[facility]-EMERGENCY DEPT Provider Note   CSN: [id number] Arrival date & time: 07/18/16  1005  First Provider Contact:  10:15 AM   By signing my name below, I, Freida Busman, attest that this documentation has been prepared under the direction and in the presence of Mancel Bale, MD . Electronically Signed: Freida Busman, Scribe. 07/18/2016. 10:28 AM.  History   Chief Complaint Chief Complaint  Patient presents with  . Fall   LEVEL 5 CAVEAT DUE TO Dementia   The history is provided by the patient. No language interpreter was used.   HPI Comments:  [name] [name] is a 82 y.o. female with a history of Dementia, who presents to the Emergency Department via EMS from [facility] s/p fall this AM. Pt states she was reaching for her call bell when she fell out of bed. She states she struck the left side of her head but denies pain to the site; no LOC. Pt notes she has had weakness in her BLE for awhile; states she ambulates with a walker. Pt denies vomiting, diarrhea and cough.    Past Medical History:  Diagnosis Date  . Chronic diastolic heart failure   . Essential hypertension, benign   . Gout   . Mixed hyperlipidemia   . Osteoarthritis   . Panic attacks   . Pulmonary hypertension (HCC)    Moderate - secondary to diastolic dysfunction  . Symptomatic advanced heart block    Status post pacemaker placement  . Type 2 diabetes mellitus [facility])     Patient Active Problem List   Diagnosis Date Noted  . Dementia 05/31/2016  . Acute on chronic diastolic heart failure (HCC) 05/31/2016  . Second degree Mobitz II AV block 03/07/2013  . Dyslipidemia   . Mitral regurgitation   . Pulmonary hypertension (HCC)   . Uncontrolled hypertension 12/08/2009  . Chronic diastolic heart failure (HCC) 12/08/2009  . PPM-St.Jude 12/08/2009    Past Surgical History:  Procedure Laterality Date  . ABDOMINAL HYSTERECTOMY    . BREAST BIOPSY    . HEMORRHOID SURGERY    . OVARIAN CYST REMOVAL    . PACEMAKER  INSERTION  04/19/07   SJM Zephyr XL DR implanted by Dr Juanda Chance for syncope and mobitz II AV block    OB History    No data available       Home Medications    Prior to Admission medications   Medication Sig Start Date End Date Taking? Authorizing Provider  acetaminophen (TYLENOL) 325 MG tablet Take 650 mg by mouth every morning. As needed   Yes Historical Provider, MD  acetaminophen (TYLENOL) 500 MG tablet Take 1,000 mg by mouth at bedtime as needed.   Yes Historical Provider, MD  amLODipine (NORVASC) 5 MG tablet Take 1 tablet (5 mg total) by mouth daily. 06/01/16  Yes Azalee Course, PA  aspirin EC 81 MG EC tablet Take 1 tablet (81 mg total) by mouth daily. 06/01/16  Yes Azalee Course, PA  carvedilol (COREG) 12.5 MG tablet Take 12.5 mg by mouth 2 (two) times daily.   Yes Historical Provider, MD  Cholecalciferol (VITAMIN D-3) 1000 UNITS CAPS Take 1 capsule by mouth daily.   Yes Historical Provider, MD  clonazePAM (KLONOPIN) 1 MG tablet Take 1 mg by mouth at bedtime.   Yes Historical Provider, MD  Cranberry 450 MG TABS Take 1 tablet by mouth 2 (two) times daily.   Yes Historical Provider, MD  donepezil (ARICEPT) 10 MG tablet Take 10 mg by mouth at bedtime.  Yes Historical Provider, MD  DULoxetine (CYMBALTA) 20 MG capsule Take 20 mg by mouth daily.   Yes Historical Provider, MD  furosemide (LASIX) 20 MG tablet Take 20 mg by mouth 2 (two) times daily.    Yes Historical Provider, MD  levothyroxine (SYNTHROID, LEVOTHROID) 25 MCG tablet Take 25 mcg by mouth daily before breakfast.   Yes Historical Provider, MD  lisinopril (PRINIVIL,ZESTRIL) 20 MG tablet Take 1.5 tablets (30 mg total) by mouth daily. Patient taking differently: Take 20 mg by mouth daily.  09/18/15  Yes Jonelle Sidle, MD  loperamide (IMODIUM) 2 MG capsule Take 2 mg by mouth every 6 (six) hours as needed. diarrhea 07/08/16  Yes Historical Provider, MD  metFORMIN (GLUCOPHAGE) 500 MG tablet Take 500-1,000 mg by mouth 2 (two) times daily with a  meal. Takes 2 in AM and 1 in PM   Yes Historical Provider, MD  Multiple Vitamin (MULTIVITAMIN) tablet Take 1 tablet by mouth daily.   Yes Historical Provider, MD  OXYGEN Inhale 2 L into the lungs daily.   Yes Historical Provider, MD  Potassium Chloride CR (MICRO-K) 8 MEQ CPCR capsule CR Take 2 capsules by mouth daily.  08/21/15  Yes Historical Provider, MD  pravastatin (PRAVACHOL) 40 MG tablet Take 40 mg by mouth daily.     Yes Historical Provider, MD  senna (SENOKOT) 8.6 MG TABS tablet Take 1 tablet by mouth 2 (two) times daily.   Yes Historical Provider, MD    Family History History reviewed. No pertinent family history.  Social History Social History  Substance Use Topics  . Smoking status: Never Smoker  . Smokeless tobacco: Never Used  . Alcohol use No     Allergies   Review of patient's allergies indicates no known allergies.   Review of Systems Review of Systems  Unable to perform ROS: Dementia    Physical Exam Updated Vital Signs BP 142/71 (BP Location: Left Arm)   Pulse (!) 56   Temp 98 F (36.7 C) (Oral)   Resp 20   Ht 5\' 4"  (1.626 m)   Wt 180 lb (81.6 kg)   SpO2 97%   BMI 30.90 kg/m   Physical Exam  Constitutional: She is oriented to person, place, and time. She appears well-developed and well-nourished.  HENT:  Head: Normocephalic and atraumatic.  Eyes: Conjunctivae and EOM are normal. Pupils are equal, round, and reactive to light.  Neck: Normal range of motion and phonation normal. Neck supple.  Cardiovascular: Normal rate and regular rhythm.   Pulmonary/Chest: Effort normal and breath sounds normal. She exhibits no tenderness.  Chest: No focal tenderness or crepitation   Abdominal: Soft. She exhibits no distension. There is no tenderness. There is no guarding.  Musculoskeletal: She exhibits no deformity.  Decreased ROM of right hip secondary to pain; no deformity or crepitation  Neurological: She is alert and oriented to person, place, and time. She  exhibits normal muscle tone.  Skin: Skin is warm and dry.  Psychiatric: She has a normal mood and affect. Her behavior is normal. Judgment and thought content normal.  Nursing note and vitals reviewed.    ED Treatments / Results  DIAGNOSTIC STUDIES:  Oxygen Saturation is 96% on 2 L [place], normal by my interpretation.     Labs (all labs ordered are listed, but only abnormal results are displayed) Labs Reviewed - No data to display  EKG  EKG Interpretation None       Radiology Dg Chest 1 View  Result Date:  07/18/2016 CLINICAL DATA:  82 year old female status post unwitnessed fall. Left shoulder pain. EXAM: CHEST 1 VIEW COMPARISON:  Prior chest x-ray 05/31/2016 FINDINGS: Left subclavian approach cardiac rhythm maintenance device. Leads project over the right atrium and right ventricle. Stable cardiomegaly. No significant vascular congestion or edema at this time. No pneumothorax or pleural effusion. Stable chronic bronchitic changes. Multilevel degenerative changes throughout the spine. No acute osseous abnormality. IMPRESSION: No active disease. Electronically Signed   By: Malachy Moan M.D.   On: 07/18/2016 11:15  Dg Pelvis 1-2 Views  Result Date: 07/18/2016 CLINICAL DATA:  Pain EXAM: PELVIS - 1-2 VIEW COMPARISON:  None. FINDINGS: Two AP views of the pelvis are provided. Osseous alignment is normal. No fracture line or displaced fracture fragment identified. No significant degenerative change at either hip joint. Perhaps mild degenerative change at the lower lumbar spine. Soft tissues about the pelvis are unremarkable. Small calcifications within the lower pelvis are most likely benign vascular phleboliths. IMPRESSION: No acute findings. Probable mild degenerative change in the lower lumbar spine. Electronically Signed   By: Bary Richard M.D.   On: 07/18/2016 11:19   Procedures Procedures   Medications Ordered in ED Medications - No data to display   Initial Impression /  Assessment and Plan / ED Course  I have reviewed the triage vital signs and the nursing notes.  Pertinent labs & imaging results that were available during my care of the patient were reviewed by me and considered in my medical decision making (see chart for details).  Clinical Course    Medications - No data to display  No data found.  BP 142/71 (BP Location: Left Arm)   Pulse (!) 56   Temp 98 F (36.7 C) (Oral)   Resp 20   Ht 5\' 4"  (1.626 m)   Wt 180 lb (81.6 kg)   SpO2 97%   BMI 30.90 kg/m   At D/C- Reevaluation with update and discussion. After initial assessment and treatment, an updated evaluation reveals she is comfortable. No further c/o. Karinna Beadles L    Final Clinical Impressions(s) / ED Diagnoses   Final diagnoses:  Contusion   Contusions d/t mechanical fall without fracture or suspected visceral injury  Nursing Notes Reviewed/ Care Coordinated Applicable Imaging Reviewed Interpretation of Laboratory Data incorporated into ED treatment  The patient appears reasonably screened and/or stabilized for discharge and I doubt any other medical condition or other [facility] requiring further screening, evaluation, or treatment in the ED at this time prior to discharge.  Plan: Home Medications- continue; Home Treatments- rest; return here if the recommended treatment, does not improve the symptoms; Recommended follow up- PCP prn    New Prescriptions Discharge Medication List as of 07/18/2016  1:32 PM      I personally performed the services described in this documentation, which was scribed in my presence. The recorded information has been reviewed and is accurate.     Mancel Bale, MD 07/20/16 1004

## 2016-07-18 NOTE — ED Notes (Signed)
Pt repositioned

## 2016-07-18 NOTE — ED Notes (Signed)
Spoke to susan at [facility], will come to pick up pt within next hour.

## 2016-07-18 NOTE — ED Triage Notes (Addendum)
Per EMS: Pt from [place], unwitnessed fall this morning out of bed after reaching for call bell onto floor hurting left shoulder and right hip pain.  No deformities or shortening/rotation noted.  Pt reports that she hit her head but denies LOC.  Pt wears 2L 02 [place] daily.  Pt has no pain with palpation to hip.  Pt alert and oriented per EMS, yet pt seems confused at this time, hard of hearing.

## 2016-07-18 NOTE — Discharge Instructions (Signed)
Take Tylenol for pain  See your Dr. for problems

## 2016-07-22 DIAGNOSIS — R0989 Other specified symptoms and signs involving the circulatory and respiratory systems: Secondary | ICD-10-CM

## 2016-07-23 ENCOUNTER — Encounter: Payer: Self-pay | Admitting: Internal Medicine

## 2016-07-23 ENCOUNTER — Encounter (INDEPENDENT_AMBULATORY_CARE_PROVIDER_SITE_OTHER): Payer: Medicare Other | Admitting: Internal Medicine

## 2016-11-01 ENCOUNTER — Telehealth: Payer: Self-pay | Admitting: Internal Medicine

## 2016-11-01 NOTE — Telephone Encounter (Signed)
Patient's son would like someone to contact him about no show that his mom was charged for

## 2016-11-02 NOTE — Telephone Encounter (Signed)
Returned call to patient's son.  Let him know that n/s charge will be adjusted per my supervisor.

## 2016-12-07 ENCOUNTER — Encounter (HOSPITAL_COMMUNITY): Payer: Self-pay | Admitting: Emergency Medicine

## 2016-12-07 ENCOUNTER — Emergency Department (HOSPITAL_COMMUNITY)
Admission: EM | Admit: 2016-12-07 | Discharge: 2016-12-07 | Disposition: A | Discharge status: Home / Self Care | Payer: Medicare Other | Attending: Emergency Medicine | Admitting: Emergency Medicine

## 2016-12-07 DIAGNOSIS — R451 Restlessness and agitation: Secondary | ICD-10-CM | POA: Diagnosis not present

## 2016-12-07 DIAGNOSIS — I110 Hypertensive heart disease with heart failure: Secondary | ICD-10-CM | POA: Diagnosis not present

## 2016-12-07 DIAGNOSIS — E119 Type 2 diabetes mellitus without complications: Secondary | ICD-10-CM | POA: Diagnosis not present

## 2016-12-07 DIAGNOSIS — I5033 Acute on chronic diastolic (congestive) heart failure: Secondary | ICD-10-CM | POA: Diagnosis not present

## 2016-12-07 DIAGNOSIS — N390 Urinary tract infection, site not specified: Secondary | ICD-10-CM | POA: Diagnosis not present

## 2016-12-07 DIAGNOSIS — Z7982 Long term (current) use of aspirin: Secondary | ICD-10-CM | POA: Diagnosis not present

## 2016-12-07 DIAGNOSIS — R410 Disorientation, unspecified: Secondary | ICD-10-CM | POA: Insufficient documentation

## 2016-12-07 DIAGNOSIS — Z79899 Other long term (current) drug therapy: Secondary | ICD-10-CM | POA: Insufficient documentation

## 2016-12-07 DIAGNOSIS — Z7984 Long term (current) use of oral hypoglycemic drugs: Secondary | ICD-10-CM | POA: Diagnosis not present

## 2016-12-07 LAB — CBC WITH DIFFERENTIAL/PLATELET
Basophils Absolute: 0 10*3/uL (ref 0.0–0.1)
Basophils Relative: 0 %
Eosinophils Absolute: 0.2 10*3/uL (ref 0.0–0.7)
Eosinophils Relative: 3 %
HCT: 38.4 % (ref 36.0–46.0)
Hemoglobin: 12.4 g/dL (ref 12.0–15.0)
Lymphocytes Relative: 25 %
Lymphs Abs: 2 10*3/uL (ref 0.7–4.0)
MCH: 28.7 pg (ref 26.0–34.0)
MCHC: 32.3 g/dL (ref 30.0–36.0)
MCV: 88.9 fL (ref 78.0–100.0)
Monocytes Absolute: 0.7 10*3/uL (ref 0.1–1.0)
Monocytes Relative: 8 %
Neutro Abs: 5.2 10*3/uL (ref 1.7–7.7)
Neutrophils Relative %: 64 %
Platelets: 163 10*3/uL (ref 150–400)
RBC: 4.32 MIL/uL (ref 3.87–5.11)
RDW: 14.6 % (ref 11.5–15.5)
WBC: 8.2 10*3/uL (ref 4.0–10.5)

## 2016-12-07 LAB — URINALYSIS, ROUTINE W REFLEX MICROSCOPIC
Bilirubin Urine: NEGATIVE
Glucose, UA: NEGATIVE mg/dL
Hgb urine dipstick: NEGATIVE
Ketones, ur: NEGATIVE mg/dL
Nitrite: POSITIVE — AB
Protein, ur: NEGATIVE mg/dL
Specific Gravity, Urine: 1.005 (ref 1.005–1.030)
pH: 6 (ref 5.0–8.0)

## 2016-12-07 LAB — BASIC METABOLIC PANEL
Anion gap: 8 (ref 5–15)
BUN: 16 mg/dL (ref 6–20)
CO2: 27 mmol/L (ref 22–32)
Calcium: 8.9 mg/dL (ref 8.9–10.3)
Chloride: 102 mmol/L (ref 101–111)
Creatinine, Ser: 0.69 mg/dL (ref 0.44–1.00)
GFR calc Af Amer: >60 mL/min (ref >60)
GFR calc non Af Amer: >60 mL/min (ref >60)
Glucose, Bld: 95 mg/dL (ref 65–99)
Potassium: 3.8 mmol/L (ref 3.5–5.1)
Sodium: 137 mmol/L (ref 135–145)

## 2016-12-07 MED ORDER — CEPHALEXIN 500 MG PO CAPS: 500.0000 mg | ORAL_CAPSULE | Freq: Three times a day (TID) | ORAL | 0 refills | Status: DC

## 2016-12-07 MED ORDER — CEPHALEXIN 500 MG PO CAPS
500.0000 mg | ORAL_CAPSULE | Freq: Once | ORAL | Status: AC
  Administered 2016-12-07: 500 mg via ORAL
  Filled 2016-12-07: qty 1

## 2016-12-07 NOTE — ED Provider Notes (Signed)
[facility]-EMERGENCY DEPT Provider Note   CSN: [id number] Arrival date & time: 12/07/16  1237  By signing my name below, I, Clovis Pu, attest that this documentation has been prepared under the direction and in the presence of Raeford Razor, MD  Electronically Signed: Clovis Pu, ED Scribe. 12/07/16. 1:16 PM.   History   Chief Complaint Chief Complaint  Patient presents with  . Agitation   The history is provided by a relative, the patient and the nursing home. No language interpreter was used.   HPI Comments:  [name] [name] is a 82 y.o. female, with a hx of dementia and UTIs, who presents to the Emergency Department complaining of sudden onset agitation x today. Son states pt resides at [facility] nursing home and broke a window. He states pt used to be on Clonazepam but was taken off of this medication. He states he thinks the nursing home is mean to her. Per relative, pt is normally on oxygen. Pt was given clonazepam prior to arrival. Pt and relative deny any other associated symptoms and modifying factors at this time.   Past Medical History:  Diagnosis Date  . Chronic diastolic heart failure   . Essential hypertension, benign   . Gout   . Mixed hyperlipidemia   . Osteoarthritis   . Panic attacks   . Pulmonary hypertension    Moderate - secondary to diastolic dysfunction  . Symptomatic advanced heart block    Status post pacemaker placement  . Type 2 diabetes mellitus [facility])     Patient Active Problem List   Diagnosis Date Noted  . Dementia 05/31/2016  . Acute on chronic diastolic heart failure (HCC) 05/31/2016  . Second degree Mobitz II AV block 03/07/2013  . Dyslipidemia   . Mitral regurgitation   . Pulmonary hypertension   . Uncontrolled hypertension 12/08/2009  . Chronic diastolic heart failure (HCC) 12/08/2009  . PPM-St.Jude 12/08/2009    Past Surgical History:  Procedure Laterality Date  . ABDOMINAL HYSTERECTOMY    . BREAST BIOPSY    . HEMORRHOID SURGERY     . OVARIAN CYST REMOVAL    . PACEMAKER INSERTION  04/19/07   SJM Zephyr XL DR implanted by Dr Juanda Chance for syncope and mobitz II AV block    OB History    No data available       Home Medications    Prior to Admission medications   Medication Sig Start Date End Date Taking? Authorizing Provider  acetaminophen (TYLENOL) 325 MG tablet Take 650 mg by mouth every morning. As needed    Historical Provider, MD  acetaminophen (TYLENOL) 500 MG tablet Take 1,000 mg by mouth at bedtime as needed.    Historical Provider, MD  amLODipine (NORVASC) 5 MG tablet Take 1 tablet (5 mg total) by mouth daily. 06/01/16   Azalee Course, PA  aspirin EC 81 MG EC tablet Take 1 tablet (81 mg total) by mouth daily. 06/01/16   Azalee Course, PA  carvedilol (COREG) 12.5 MG tablet Take 12.5 mg by mouth 2 (two) times daily.    Historical Provider, MD  Cholecalciferol (VITAMIN D-3) 1000 UNITS CAPS Take 1 capsule by mouth daily.    Historical Provider, MD  clonazePAM (KLONOPIN) 1 MG tablet Take 1 mg by mouth at bedtime.    Historical Provider, MD  Cranberry 450 MG TABS Take 1 tablet by mouth 2 (two) times daily.    Historical Provider, MD  donepezil (ARICEPT) 10 MG tablet Take 10 mg by mouth at bedtime.  Historical Provider, MD  DULoxetine (CYMBALTA) 20 MG capsule Take 20 mg by mouth daily.    Historical Provider, MD  furosemide (LASIX) 20 MG tablet Take 20 mg by mouth 2 (two) times daily.     Historical Provider, MD  levothyroxine (SYNTHROID, LEVOTHROID) 25 MCG tablet Take 25 mcg by mouth daily before breakfast.    Historical Provider, MD  lisinopril (PRINIVIL,ZESTRIL) 20 MG tablet Take 1.5 tablets (30 mg total) by mouth daily. Patient taking differently: Take 20 mg by mouth daily.  09/18/15   Jonelle SidleSamuel G McDowell, MD  loperamide (IMODIUM) 2 MG capsule Take 2 mg by mouth every 6 (six) hours as needed. diarrhea 07/08/16   Historical Provider, MD  metFORMIN (GLUCOPHAGE) 500 MG tablet Take 500-1,000 mg by mouth 2 (two) times daily with a  meal. Takes 2 in AM and 1 in PM    Historical Provider, MD  Multiple Vitamin (MULTIVITAMIN) tablet Take 1 tablet by mouth daily.    Historical Provider, MD  OXYGEN Inhale 2 L into the lungs daily.    Historical Provider, MD  Potassium Chloride CR (MICRO-K) 8 MEQ CPCR capsule CR Take 2 capsules by mouth daily.  08/21/15   Historical Provider, MD  pravastatin (PRAVACHOL) 40 MG tablet Take 40 mg by mouth daily.      Historical Provider, MD  senna (SENOKOT) 8.6 MG TABS tablet Take 1 tablet by mouth 2 (two) times daily.    Historical Provider, MD    Family History History reviewed. No pertinent family history.  Social History Social History  Substance Use Topics  . Smoking status: Never Smoker  . Smokeless tobacco: Never Used  . Alcohol use No     Allergies   Patient has no known allergies.   Review of Systems Review of Systems  Constitutional: Negative for fever.  Psychiatric/Behavioral: Positive for agitation and confusion.  All other systems reviewed and are negative.  Physical Exam Updated Vital Signs BP 145/62 (BP Location: Left Arm)   Pulse (!) 52   Temp 98.1 F (36.7 C) (Oral)   Resp 18   Ht 5\' 8"  (1.727 m)   Wt 180 lb (81.6 kg)   SpO2 94%   BMI 27.37 kg/m   Physical Exam  Constitutional: She appears well-developed and well-nourished. No distress.  Drowsy.  HENT:  Head: Normocephalic and atraumatic.  Eyes: Conjunctivae are normal.  Cardiovascular: Normal rate.   Pulmonary/Chest: Effort normal.  Abdominal: She exhibits no distension.  Neurological: She is alert.  Responds to questions, follows commands, presently confused   Skin: Skin is warm and dry.  Psychiatric: She has a normal mood and affect.  Nursing note and vitals reviewed.   ED Treatments / Results  DIAGNOSTIC STUDIES:  Oxygen Saturation is 94% on [place], normal by my interpretation.    COORDINATION OF CARE:  1:10 PM Discussed treatment plan with pt at bedside and pt agreed to  plan.  Labs (all labs ordered are listed, but only abnormal results are displayed) Labs Reviewed  URINE CULTURE - Abnormal; Notable for the following:       Result Value   Culture >=100,000 COLONIES/mL KLEBSIELLA PNEUMONIAE (*)    Organism ID, Bacteria KLEBSIELLA PNEUMONIAE (*)    All other components within normal limits  URINALYSIS, ROUTINE W REFLEX MICROSCOPIC - Abnormal; Notable for the following:    Nitrite POSITIVE (*)    Leukocytes, UA SMALL (*)    Bacteria, UA FEW (*)    All other components within normal limits  CBC  WITH DIFFERENTIAL/PLATELET  BASIC METABOLIC PANEL    EKG  EKG Interpretation None       Radiology No results found.  Procedures Procedures (including critical care time)  Medications Ordered in ED Medications - No data to display   Initial Impression / Assessment and Plan / ED Course  I have reviewed the triage vital signs and the nursing notes.  Pertinent labs & imaging results that were available during my care of the patient were reviewed by me and considered in my medical decision making (see chart for details).  Clinical Course     A 82 year old female with increased agitation. Her son is at bedside. He reports that she is currently more or less her baseline mental status. Her urinalysis is consistent with UTI. This could explain the more acute change in her behavior. She is afebrile. Nontoxic. I feel she is appropriate for discharge and outpatient treatment.  Final Clinical Impressions(s) / ED Diagnoses   Final diagnoses:  Urinary tract infection without hematuria, site unspecified  Agitation    New Prescriptions New Prescriptions   No medications on file    I personally preformed the services scribed in my presence. The recorded information has been reviewed is accurate. Raeford RazorStephen Diantha Paxson, MD.     Raeford RazorStephen Brandonlee Navis, MD 12/14/16 [phone]

## 2016-12-07 NOTE — ED Triage Notes (Addendum)
Pt brought in by EMS for evaluation after becoming agitated with staff [facility] Facility administered 1mg  clonazepam prior to arrival. Pt arousable with stimulation but asleep at time of arrival. cbg 122 en route. Pt on 2 liters [place] all the time per EMS.

## 2016-12-07 NOTE — ED Notes (Addendum)
Per patient's son and healthcare power of attorney: Son states that he and his family do not have the capability to take patient back to [facility] by private vehicle. Patient is able to help with transferring but is unable to walk. Patient's children are in poor health.   Patient's son, Peyton NajjarLarry, states that [facility] suggested [facility] transfer via EMS.

## 2016-12-10 LAB — URINE CULTURE: Culture: >=100000 — AB

## 2016-12-11 ENCOUNTER — Telehealth: Payer: Self-pay

## 2016-12-11 NOTE — Telephone Encounter (Signed)
Post ED Visit - Positive Culture Follow-up  Culture report reviewed by antimicrobial stewardship pharmacist:  []  Enzo BiNathan Batchelder, Pharm.D. []  Celedonio MiyamotoJeremy Frens, Pharm.D., BCPS []  Garvin FilaMike Maccia, Pharm.D. []  Georgina PillionElizabeth Martin, [address]Pharm[address]D., BCPS []  OrdMinh Pham, [place]arm[place]D., BCPS, AAHIVP []  Estella HuskMichelle Turner, Pharm.D., BCPS, AAHIVP []  Tennis Mustassie Stewart, Pharm.D. []  Sherle Poeob Vincent, VermontPharm.D. Revonda StandardAllison Masters Pharm D Positive urine culture Treated with Cephalexin, organism sensitive to the same and no further patient follow-up is required at this time.  Jerry CarasCullom, Lexani Corona Burnett 12/11/2016, 9:37 AM

## 2016-12-27 ENCOUNTER — Emergency Department (HOSPITAL_COMMUNITY): Payer: Medicare Other

## 2016-12-27 ENCOUNTER — Encounter (HOSPITAL_COMMUNITY): Payer: Self-pay | Admitting: Emergency Medicine

## 2016-12-27 ENCOUNTER — Emergency Department (HOSPITAL_COMMUNITY)
Admission: EM | Admit: 2016-12-27 | Discharge: 2016-12-27 | Disposition: A | Discharge status: Home / Self Care | Payer: Medicare Other | Attending: Emergency Medicine | Admitting: Emergency Medicine

## 2016-12-27 DIAGNOSIS — I110 Hypertensive heart disease with heart failure: Secondary | ICD-10-CM | POA: Diagnosis not present

## 2016-12-27 DIAGNOSIS — Z79899 Other long term (current) drug therapy: Secondary | ICD-10-CM | POA: Insufficient documentation

## 2016-12-27 DIAGNOSIS — I5032 Chronic diastolic (congestive) heart failure: Secondary | ICD-10-CM | POA: Diagnosis not present

## 2016-12-27 DIAGNOSIS — E119 Type 2 diabetes mellitus without complications: Secondary | ICD-10-CM | POA: Diagnosis not present

## 2016-12-27 DIAGNOSIS — Z7984 Long term (current) use of oral hypoglycemic drugs: Secondary | ICD-10-CM | POA: Insufficient documentation

## 2016-12-27 DIAGNOSIS — Z950 Presence of cardiac pacemaker: Secondary | ICD-10-CM | POA: Insufficient documentation

## 2016-12-27 DIAGNOSIS — N3000 Acute cystitis without hematuria: Secondary | ICD-10-CM | POA: Diagnosis not present

## 2016-12-27 DIAGNOSIS — R4182 Altered mental status, unspecified: Secondary | ICD-10-CM | POA: Diagnosis present

## 2016-12-27 LAB — TROPONIN I: Troponin I: <0.03 ng/mL (ref <0.03)

## 2016-12-27 LAB — URINALYSIS, ROUTINE W REFLEX MICROSCOPIC
BILIRUBIN URINE: NEGATIVE
GLUCOSE, UA: NEGATIVE mg/dL
HGB URINE DIPSTICK: NEGATIVE
KETONES UR: NEGATIVE mg/dL
Nitrite: NEGATIVE
Specific Gravity, Urine: 1.015 (ref 1.005–1.030)
pH: 6 (ref 5.0–8.0)

## 2016-12-27 LAB — CBC
HEMATOCRIT: 41.8 % (ref 36.0–46.0)
Hemoglobin: 13.7 g/dL (ref 12.0–15.0)
MCH: 29.5 pg (ref 26.0–34.0)
MCHC: 32.8 g/dL (ref 30.0–36.0)
MCV: 89.9 fL (ref 78.0–100.0)
Platelets: 179 10*3/uL (ref 150–400)
RBC: 4.65 MIL/uL (ref 3.87–5.11)
RDW: 15.2 % (ref 11.5–15.5)
WBC: 8.1 10*3/uL (ref 4.0–10.5)

## 2016-12-27 LAB — COMPREHENSIVE METABOLIC PANEL
ALK PHOS: 39 U/L (ref 38–126)
ALT: 12 U/L — ABNORMAL LOW (ref 14–54)
ANION GAP: 8 (ref 5–15)
AST: 18 U/L (ref 15–41)
Albumin: 4.1 g/dL (ref 3.5–5.0)
BILIRUBIN TOTAL: 0.5 mg/dL (ref 0.3–1.2)
BUN: 19 mg/dL (ref 6–20)
CALCIUM: 9.2 mg/dL (ref 8.9–10.3)
CO2: 29 mmol/L (ref 22–32)
Chloride: 103 mmol/L (ref 101–111)
Creatinine, Ser: 0.8 mg/dL (ref 0.44–1.00)
GFR calc Af Amer: >60 mL/min (ref >60)
Glucose, Bld: 99 mg/dL (ref 65–99)
POTASSIUM: 3.7 mmol/L (ref 3.5–5.1)
Sodium: 140 mmol/L (ref 135–145)
TOTAL PROTEIN: 7.2 g/dL (ref 6.5–8.1)

## 2016-12-27 LAB — URINALYSIS, MICROSCOPIC (REFLEX): RBC / HPF: NONE SEEN RBC/hpf (ref 0–5)

## 2016-12-27 MED ORDER — SODIUM CHLORIDE 0.9 % IV BOLUS (SEPSIS)
500.0000 mL | Freq: Once | INTRAVENOUS | Status: AC
  Administered 2016-12-27: 500 mL via INTRAVENOUS

## 2016-12-27 MED ORDER — DEXTROSE 5 % IV SOLN
1.0000 g | Freq: Once | INTRAVENOUS | Status: AC
  Administered 2016-12-27: 1 g via INTRAVENOUS
  Filled 2016-12-27: qty 10

## 2016-12-27 MED ORDER — CEPHALEXIN 500 MG PO CAPS: 500.0000 mg | ORAL_CAPSULE | Freq: Four times a day (QID) | ORAL | 0 refills | Status: DC

## 2016-12-27 NOTE — ED Triage Notes (Addendum)
Per EMS, pt found sitting in floor of room. Per facility staff, pt not acting right. Last seen normal "last night/this morning" per facility. Per facility pt was using walker and sat down. Pt alert, responding to painful stimuli. cbg 105 en route.

## 2016-12-27 NOTE — ED Provider Notes (Signed)
[facility]-EMERGENCY DEPT Provider Note   CSN: [id number] Arrival date & time: 12/27/16  1133  By signing my name below, I, Avnee Patel, attest that this documentation has been prepared under the direction and in the presence of Bethann BerkshireJoseph Dalaya Suppa, MD  Electronically Signed: Clovis PuAvnee Patel, ED Scribe. 12/27/16. 12:26 PM.   History   Chief Complaint Chief Complaint  Patient presents with  . Altered Mental Status   The history is provided by the patient. No language interpreter was used.  Altered Mental Status   This is a new problem. The current episode started 1 to 2 hours ago. The problem has not changed since onset.Associated symptoms include weakness. Pertinent negatives include no seizures, no unresponsiveness and no hallucinations. Risk factors: unknown. Her past medical history is significant for dementia.   HPI Comments: LEVEL 5 CAVEAT DUE TO ALTERED MENTAL STATUS    [name] [name] is a 83 y.o. female who presents to the Emergency Department in altered mental status. Per nurse, pt is from a facility where she resides for Alzheimers. Per facility, pt was last seen normal in the early AM today and note she was found sitting in the floor of her room today.     Past Medical History:  Diagnosis Date  . Chronic diastolic heart failure   . Essential hypertension, benign   . Gout   . Mixed hyperlipidemia   . Osteoarthritis   . Panic attacks   . Pulmonary hypertension    Moderate - secondary to diastolic dysfunction  . Symptomatic advanced heart block    Status post pacemaker placement  . Type 2 diabetes mellitus [facility](HCC)     Patient Active Problem List   Diagnosis Date Noted  . Dementia 05/31/2016  . Acute on chronic diastolic heart failure (HCC) 05/31/2016  . Second degree Mobitz II AV block 03/07/2013  . Dyslipidemia   . Mitral regurgitation   . Pulmonary hypertension   . Uncontrolled hypertension 12/08/2009  . Chronic diastolic heart failure (HCC) 12/08/2009  . PPM-St.Jude  12/08/2009    Past Surgical History:  Procedure Laterality Date  . ABDOMINAL HYSTERECTOMY    . BREAST BIOPSY    . HEMORRHOID SURGERY    . OVARIAN CYST REMOVAL    . PACEMAKER INSERTION  04/19/07   SJM Zephyr XL DR implanted by Dr Juanda ChanceBrodie for syncope and mobitz II AV block    OB History    No data available       Home Medications    Prior to Admission medications   Medication Sig Start Date End Date Taking? Authorizing Provider  acetaminophen (TYLENOL) 325 MG tablet Take 650 mg by mouth every morning. As needed    Historical Provider, MD  acetaminophen (TYLENOL) 500 MG tablet Take 1,000 mg by mouth at bedtime as needed.    Historical Provider, MD  amLODipine (NORVASC) 5 MG tablet Take 1 tablet (5 mg total) by mouth daily. 06/01/16   Azalee CourseHao Meng, PA  aspirin EC 81 MG EC tablet Take 1 tablet (81 mg total) by mouth daily. 06/01/16   Azalee CourseHao Meng, PA  carvedilol (COREG) 12.5 MG tablet Take 12.5 mg by mouth 2 (two) times daily.    Historical Provider, MD  cephALEXin (KEFLEX) 500 MG capsule Take 1 capsule (500 mg total) by mouth 3 (three) times daily. 12/07/16   Raeford RazorStephen Kohut, MD  Cholecalciferol (VITAMIN D-3) 1000 UNITS CAPS Take 1 capsule by mouth daily.    Historical Provider, MD  clonazePAM (KLONOPIN) 1 MG tablet Take 1 mg  by mouth at bedtime.    Historical Provider, MD  Cranberry 450 MG TABS Take 1 tablet by mouth 2 (two) times daily.    Historical Provider, MD  donepezil (ARICEPT) 10 MG tablet Take 10 mg by mouth at bedtime.    Historical Provider, MD  DULoxetine (CYMBALTA) 20 MG capsule Take 20 mg by mouth daily.    Historical Provider, MD  furosemide (LASIX) 20 MG tablet Take 20 mg by mouth 2 (two) times daily.     Historical Provider, MD  levothyroxine (SYNTHROID, LEVOTHROID) 25 MCG tablet Take 25 mcg by mouth daily before breakfast.    Historical Provider, MD  lisinopril (PRINIVIL,ZESTRIL) 20 MG tablet Take 1.5 tablets (30 mg total) by mouth daily. Patient taking differently: Take 20 mg by  mouth daily.  09/18/15   Jonelle Sidle, MD  loperamide (IMODIUM) 2 MG capsule Take 2 mg by mouth every 6 (six) hours as needed. diarrhea 07/08/16   Historical Provider, MD  metFORMIN (GLUCOPHAGE) 500 MG tablet Take 500-1,000 mg by mouth 2 (two) times daily with a meal. Takes 2 in AM and 1 in PM    Historical Provider, MD  Multiple Vitamin (MULTIVITAMIN) tablet Take 1 tablet by mouth daily.    Historical Provider, MD  OXYGEN Inhale 2 L into the lungs daily.    Historical Provider, MD  Potassium Chloride CR (MICRO-K) 8 MEQ CPCR capsule CR Take 2 capsules by mouth daily.  08/21/15   Historical Provider, MD  pravastatin (PRAVACHOL) 40 MG tablet Take 40 mg by mouth daily.      Historical Provider, MD  senna (SENOKOT) 8.6 MG TABS tablet Take 1 tablet by mouth 2 (two) times daily.    Historical Provider, MD    Family History History reviewed. No pertinent family history.  Social History Social History  Substance Use Topics  . Smoking status: Never Smoker  . Smokeless tobacco: Never Used  . Alcohol use No     Allergies   Patient has no known allergies.   Review of Systems Review of Systems  Unable to perform ROS: Mental status change  Constitutional: Negative for appetite change and fatigue.  HENT: Negative for congestion, ear discharge and sinus pressure.   Eyes: Negative for discharge.  Respiratory: Negative for cough.   Cardiovascular: Negative for chest pain.  Gastrointestinal: Negative for abdominal pain and diarrhea.  Genitourinary: Negative for frequency and hematuria.  Musculoskeletal: Negative for back pain.  Skin: Negative for rash.  Neurological: Positive for weakness. Negative for seizures and headaches.  Psychiatric/Behavioral: Negative for hallucinations.   Physical Exam Updated Vital Signs BP (!) 147/49 (BP Location: Right Arm)   Pulse (!) 55   Temp 97.9 F (36.6 C) (Oral)   Resp 18   Ht 5\' 6"  (1.676 m)   Wt 180 lb (81.6 kg)   SpO2 91%   BMI 29.05 kg/m    Physical Exam  Constitutional: She appears well-developed.  HENT:  Head: Normocephalic.  Eyes: Conjunctivae and EOM are normal. No scleral icterus.  Neck: Neck supple. No thyromegaly present.  Cardiovascular: Normal rate and regular rhythm.  Exam reveals no gallop and no friction rub.   No murmur heard. Pulmonary/Chest: No stridor. She has no wheezes. She has no rales. She exhibits no tenderness.  Abdominal: She exhibits no distension. There is no tenderness. There is no rebound.  Musculoskeletal: Normal range of motion. She exhibits no edema.  Lymphadenopathy:    She has no cervical adenopathy.  Neurological: She exhibits normal muscle  tone. Coordination normal.  Oriented only to person   Skin: No rash noted. No erythema.  Nursing note and vitals reviewed.  ED Treatments / Results  DIAGNOSTIC STUDIES:  Oxygen Saturation is 91% on [place], normal by my interpretation.    Labs (all labs ordered are listed, but only abnormal results are displayed) Labs Reviewed  COMPREHENSIVE METABOLIC PANEL  CBC    EKG  EKG Interpretation None       Radiology No results found.  Procedures Procedures (including critical care time)  Medications Ordered in ED Medications - No data to display   Initial Impression / Assessment and Plan / ED Course  I have reviewed the triage vital signs and the nursing notes.  Pertinent labs & imaging results that were available during my care of the patient were reviewed by me and considered in my medical decision making (see chart for details).  Clinical Course      Patient with urinary tract infection. I spoke with the hospitalist who felt like patient could be treated as an outpatient. The nursing home was called and they felt comfortable taking care of the patient. She will be discharged with Keflex  Final Clinical Impressions(s) / ED Diagnoses   Final diagnoses:  None   The chart was scribed for me under my direct supervision.  I  personally performed the history, physical, and medical decision making and all procedures in the evaluation of this patient..  New Prescriptions New Prescriptions   No medications on file  The chart was scribed for me under my direct supervision.  I personally performed the history, physical, and medical decision making and all procedures in the evaluation of this patient.[name], MD 12/27/16 1535

## 2016-12-27 NOTE — Discharge Instructions (Signed)
Drink plenty of fluids and follow-up with your doctor this week for recheck °

## 2016-12-31 LAB — URINE CULTURE

## 2017-01-01 ENCOUNTER — Telehealth: Payer: Self-pay

## 2017-01-01 NOTE — Telephone Encounter (Signed)
Post ED Visit - Positive Culture Follow-up  Culture report reviewed by antimicrobial stewardship pharmacist:  []  Enzo BiNathan Batchelder, Pharm.D. []  Celedonio MiyamotoJeremy Frens, [address]Pharm.D., BCPS []  Garvin FilaMike Maccia, Pharm.D. []  Georgina PillionElizabeth Martin, Pharm.D., BCPS []  New SalemMinh Pham, [place]Pharm.D., BCPS, AAHIVP []  Estella HuskMichelle Turner, Pharm.D., BCPS, AAHIVP []  Cassie Stewart, Pharm.D. []  Rob Oswaldo DoneVincent, [address]Pharm[address] Rachel Rumbarger Pharm D Positive urine culture Treated with Cephalexin, organism sensitive to the same and no further patient follow-up is required at this time.  Jerry CarasCullom, Antionetta Ator Burnett 01/01/2017, 11:14 AM

## 2017-01-30 ENCOUNTER — Emergency Department (HOSPITAL_COMMUNITY)
Admission: EM | Admit: 2017-01-30 | Discharge: 2017-01-30 | Disposition: A | Discharge status: Home / Self Care | Payer: Medicare Other | Attending: Emergency Medicine | Admitting: Emergency Medicine

## 2017-01-30 ENCOUNTER — Emergency Department (HOSPITAL_COMMUNITY): Payer: Medicare Other

## 2017-01-30 ENCOUNTER — Encounter (HOSPITAL_COMMUNITY): Payer: Self-pay

## 2017-01-30 DIAGNOSIS — F0390 Unspecified dementia without behavioral disturbance: Secondary | ICD-10-CM | POA: Insufficient documentation

## 2017-01-30 DIAGNOSIS — Z7984 Long term (current) use of oral hypoglycemic drugs: Secondary | ICD-10-CM | POA: Insufficient documentation

## 2017-01-30 DIAGNOSIS — Y939 Activity, unspecified: Secondary | ICD-10-CM | POA: Diagnosis not present

## 2017-01-30 DIAGNOSIS — I110 Hypertensive heart disease with heart failure: Secondary | ICD-10-CM | POA: Diagnosis not present

## 2017-01-30 DIAGNOSIS — Y999 Unspecified external cause status: Secondary | ICD-10-CM | POA: Diagnosis not present

## 2017-01-30 DIAGNOSIS — Z79899 Other long term (current) drug therapy: Secondary | ICD-10-CM | POA: Insufficient documentation

## 2017-01-30 DIAGNOSIS — W1809XA Striking against other object with subsequent fall, initial encounter: Secondary | ICD-10-CM | POA: Insufficient documentation

## 2017-01-30 DIAGNOSIS — S4991XA Unspecified injury of right shoulder and upper arm, initial encounter: Secondary | ICD-10-CM | POA: Diagnosis present

## 2017-01-30 DIAGNOSIS — Y92009 Unspecified place in unspecified non-institutional (private) residence as the place of occurrence of the external cause: Secondary | ICD-10-CM

## 2017-01-30 DIAGNOSIS — I5032 Chronic diastolic (congestive) heart failure: Secondary | ICD-10-CM | POA: Diagnosis not present

## 2017-01-30 DIAGNOSIS — S42031A Displaced fracture of lateral end of right clavicle, initial encounter for closed fracture: Secondary | ICD-10-CM

## 2017-01-30 DIAGNOSIS — E119 Type 2 diabetes mellitus without complications: Secondary | ICD-10-CM | POA: Insufficient documentation

## 2017-01-30 DIAGNOSIS — W19XXXA Unspecified fall, initial encounter: Secondary | ICD-10-CM

## 2017-01-30 DIAGNOSIS — Y929 Unspecified place or not applicable: Secondary | ICD-10-CM | POA: Insufficient documentation

## 2017-01-30 LAB — URINALYSIS, ROUTINE W REFLEX MICROSCOPIC
BILIRUBIN URINE: NEGATIVE
GLUCOSE, UA: NEGATIVE mg/dL
Hgb urine dipstick: NEGATIVE
KETONES UR: NEGATIVE mg/dL
Leukocytes, UA: NEGATIVE
NITRITE: NEGATIVE
PH: 7 (ref 5.0–8.0)
Protein, ur: NEGATIVE mg/dL
Specific Gravity, Urine: 1.005 (ref 1.005–1.030)

## 2017-01-30 MED ORDER — NAPROXEN 250 MG PO TABS: 250.0000 mg | ORAL_TABLET | Freq: Two times a day (BID) | ORAL | 0 refills | Status: DC

## 2017-01-30 MED ORDER — OXYCODONE-ACETAMINOPHEN 5-325 MG PO TABS: 1.0000 | ORAL_TABLET | ORAL | 0 refills | Status: DC | PRN

## 2017-01-30 NOTE — ED Triage Notes (Signed)
Pt c/o of right shoulder pain to EMS

## 2017-01-30 NOTE — ED Notes (Signed)
Report given to Hilda LiasMarie at [facility]. All questions answered. Per her they currently have no transporters, she is stating EMS will have to bring patient back. [facility]

## 2017-01-30 NOTE — ED Triage Notes (Signed)
[facility], staff heard patient fall, upon their arrival patient was in floor awake. Staff states no LOC. Dementia at baseline. Staff concern patient has UTI due to urinary frequence and foul smell

## 2017-01-30 NOTE — ED Provider Notes (Signed)
[facility]Y DEPT Provider Note   CSN: [id number] Arrival date & time: 01/30/17  0213     History   Chief Complaint Chief Complaint  Patient presents with  . Fall    HPI [name] [name] is a 83 y.o. female.  She got up to walk, but her cane was not available and she fell striking her right shoulder. She denies other injury. She denies also consciousness.   The history is provided by the patient.  Fall     Past Medical History:  Diagnosis Date  . Chronic diastolic heart failure   . Essential hypertension, benign   . Gout   . Mixed hyperlipidemia   . Osteoarthritis   . Panic attacks   . Pulmonary hypertension    Moderate - secondary to diastolic dysfunction  . Symptomatic advanced heart block    Status post pacemaker placement  . Type 2 diabetes mellitus [facility])     Patient Active Problem List   Diagnosis Date Noted  . Dementia 05/31/2016  . Acute on chronic diastolic heart failure (HCC) 05/31/2016  . Second degree Mobitz II AV block 03/07/2013  . Dyslipidemia   . Mitral regurgitation   . Pulmonary hypertension   . Uncontrolled hypertension 12/08/2009  . Chronic diastolic heart failure (HCC) 12/08/2009  . PPM-St.Jude 12/08/2009    Past Surgical History:  Procedure Laterality Date  . ABDOMINAL HYSTERECTOMY    . BREAST BIOPSY    . HEMORRHOID SURGERY    . OVARIAN CYST REMOVAL    . PACEMAKER INSERTION  04/19/07   SJM Zephyr XL DR implanted by Dr Juanda Chance for syncope and mobitz II AV block    OB History    No data available       Home Medications    Prior to Admission medications   Medication Sig Start Date End Date Taking? Authorizing Provider  acetaminophen (TYLENOL) 325 MG tablet Take 650 mg by mouth every morning. As needed    Historical Provider, MD  acetaminophen (TYLENOL) 500 MG tablet Take 1,000 mg by mouth at bedtime as needed.    Historical Provider, MD  amLODipine (NORVASC) 5 MG tablet Take 1 tablet (5 mg total) by mouth daily. 06/01/16   Azalee Course, PA  CALCIUM PO Take 1 tablet by mouth daily.    Historical Provider, MD  carvedilol (COREG) 12.5 MG tablet Take 12.5 mg by mouth 2 (two) times daily.    Historical Provider, MD  cephALEXin (KEFLEX) 500 MG capsule Take 1 capsule (500 mg total) by mouth 4 (four) times daily. 12/27/16   [name], MD  Cholecalciferol (VITAMIN D-3) 1000 UNITS CAPS Take 1 capsule by mouth daily.    Historical Provider, MD  clonazePAM (KLONOPIN) 0.5 MG tablet Take 0.5 mg by mouth 2 (two) times daily as needed for anxiety. One tablet at 0700 and 1 tablet at 1300.    Historical Provider, MD  clonazePAM (KLONOPIN) 1 MG tablet Take 1 mg by mouth at bedtime.    Historical Provider, MD  Cranberry 450 MG TABS Take 1 tablet by mouth 2 (two) times daily.    Historical Provider, MD  donepezil (ARICEPT) 10 MG tablet Take 10 mg by mouth at bedtime.    Historical Provider, MD  DULoxetine (CYMBALTA) 20 MG capsule Take 20 mg by mouth daily.    Historical Provider, MD  furosemide (LASIX) 20 MG tablet Take 20 mg by mouth 2 (two) times daily.     Historical Provider, MD  levothyroxine (SYNTHROID, LEVOTHROID) 25 MCG  tablet Take 25 mcg by mouth daily before breakfast.    Historical Provider, MD  lisinopril (PRINIVIL,ZESTRIL) 20 MG tablet Take 1.5 tablets (30 mg total) by mouth daily. Patient taking differently: Take 20 mg by mouth daily.  09/18/15   Jonelle Sidle, MD  loperamide (IMODIUM) 2 MG capsule Take 2 mg by mouth every 6 (six) hours as needed. diarrhea 07/08/16   Historical Provider, MD  metFORMIN (GLUCOPHAGE) 500 MG tablet Take 500-1,000 mg by mouth 2 (two) times daily with a meal. Takes 2 in AM and 1 in PM    Historical Provider, MD  Multiple Vitamin (MULTIVITAMIN) tablet Take 1 tablet by mouth daily.    Historical Provider, MD  OXYGEN Inhale 2 L into the lungs daily.    Historical Provider, MD  Potassium Chloride CR (MICRO-K) 8 MEQ CPCR capsule CR Take 2 capsules by mouth daily.  08/21/15   Historical Provider, MD    pravastatin (PRAVACHOL) 40 MG tablet Take 40 mg by mouth daily.      Historical Provider, MD    Family History History reviewed. No pertinent family history.  Social History Social History  Substance Use Topics  . Smoking status: Never Smoker  . Smokeless tobacco: Never Used  . Alcohol use No     Allergies   Patient has no known allergies.   Review of Systems Review of Systems  All other systems reviewed and are negative.    Physical Exam Updated Vital Signs BP 174/75 (BP Location: Left Arm)   Pulse (!) 59   Temp 97.5 F (36.4 C) (Oral)   Resp 20   SpO2 95%   Physical Exam  Nursing note and vitals reviewed.  83 year old female, resting comfortably and in no acute distress. Vital signs are Significant for hypertension. Oxygen saturation is 95%, which is normal. Head is normocephalic and atraumatic. PERRLA, EOMI. Oropharynx is clear. Neck is nontender and supple without adenopathy or JVD. Back is nontender and there is no CVA tenderness. Lungs are clear without rales, wheezes, or rhonchi. Chest is nontender. Heart has regular rate and rhythm without murmur. Abdomen is soft, flat, nontender without masses or hepatosplenomegaly and peristalsis is normoactive. Extremities have trace edema, full range of motion is present. Skin is warm and dry without rash. Neurologic: Mental status is normal, cranial nerves are intact, there are no motor or sensory deficits.  ED Treatments / Results  Labs (all labs ordered are listed, but only abnormal results are displayed) Labs Reviewed  URINALYSIS, ROUTINE W REFLEX MICROSCOPIC - Abnormal; Notable for the following:       Result Value   Color, Urine STRAW (*)    All other components within normal limits    Radiology Dg Shoulder Right  Result Date: 01/30/2017 CLINICAL DATA:  Fall with right shoulder pain. EXAM: RIGHT SHOULDER - 2+ VIEW COMPARISON:  None. FINDINGS: Mildly displaced distal clavicle fracture. No definite  extension of the acromioclavicular joint. No additional acute fracture. No dislocation. Mild glenohumeral osteoarthritis. IMPRESSION: Mildly displaced distal clavicle fracture. Electronically Signed   By: Rubye Oaks M.D.   On: 01/30/2017 05:59   Ct Head Wo Contrast  Result Date: 01/30/2017 CLINICAL DATA:  Fall, found on floor. Dementia patient unable to provide history. EXAM: CT HEAD WITHOUT CONTRAST CT CERVICAL SPINE WITHOUT CONTRAST TECHNIQUE: Multidetector CT imaging of the head and cervical spine was performed following the standard protocol without intravenous contrast. Multiplanar CT image reconstructions of the cervical spine were also generated. COMPARISON:  Head  CT 12/27/2016 FINDINGS: CT HEAD FINDINGS Brain: No evidence of acute infarction, hemorrhage, hydrocephalus, extra-axial collection or mass lesion/mass effect. Stable generalized atrophy. Stable chronic small vessel ischemia. Again seen remote lacunar infarcts in the left greater than right basal ganglia. Vascular: Atherosclerosis of skullbase vasculature without hyperdense vessel or abnormal calcification. Skull: Normal. Negative for fracture or focal lesion. Sinuses/Orbits: Paranasal sinuses and mastoid air cells are clear. Bilateral cataract resection. The visualized orbits are unremarkable. Other: None. CT CERVICAL SPINE FINDINGS Alignment: Minimal anterolisthesis of C4 on C5 appears degenerative. No jumped or perched facets. Skull base and vertebrae: No acute fracture. The dens is intact. No primary bone lesion or focal pathologic process. Soft tissues and spinal canal: No prevertebral fluid or swelling. No visible canal hematoma. Disc levels: Multilevel degenerative change. Diffuse disc space narrowing and endplate spurring throughout, most prominent at C5-C6. Multilevel facet arthropathy. Upper chest: No acute abnormality. Other: Carotid vascular calcifications. IMPRESSION: 1. No acute intracranial abnormality. Stable atrophy and  chronic small vessel ischemia. 2. Multilevel degenerative change throughout the cervical spine without acute fracture or subluxation. Electronically Signed   By: Rubye OaksMelanie  Ehinger M.D.   On: 01/30/2017 06:06   Ct Cervical Spine Wo Contrast  Result Date: 01/30/2017 CLINICAL DATA:  Fall, found on floor. Dementia patient unable to provide history. EXAM: CT HEAD WITHOUT CONTRAST CT CERVICAL SPINE WITHOUT CONTRAST TECHNIQUE: Multidetector CT imaging of the head and cervical spine was performed following the standard protocol without intravenous contrast. Multiplanar CT image reconstructions of the cervical spine were also generated. COMPARISON:  Head CT 12/27/2016 FINDINGS: CT HEAD FINDINGS Brain: No evidence of acute infarction, hemorrhage, hydrocephalus, extra-axial collection or mass lesion/mass effect. Stable generalized atrophy. Stable chronic small vessel ischemia. Again seen remote lacunar infarcts in the left greater than right basal ganglia. Vascular: Atherosclerosis of skullbase vasculature without hyperdense vessel or abnormal calcification. Skull: Normal. Negative for fracture or focal lesion. Sinuses/Orbits: Paranasal sinuses and mastoid air cells are clear. Bilateral cataract resection. The visualized orbits are unremarkable. Other: None. CT CERVICAL SPINE FINDINGS Alignment: Minimal anterolisthesis of C4 on C5 appears degenerative. No jumped or perched facets. Skull base and vertebrae: No acute fracture. The dens is intact. No primary bone lesion or focal pathologic process. Soft tissues and spinal canal: No prevertebral fluid or swelling. No visible canal hematoma. Disc levels: Multilevel degenerative change. Diffuse disc space narrowing and endplate spurring throughout, most prominent at C5-C6. Multilevel facet arthropathy. Upper chest: No acute abnormality. Other: Carotid vascular calcifications. IMPRESSION: 1. No acute intracranial abnormality. Stable atrophy and chronic small vessel ischemia. 2.  Multilevel degenerative change throughout the cervical spine without acute fracture or subluxation. Electronically Signed   By: Rubye OaksMelanie  Ehinger M.D.   On: 01/30/2017 06:06    Procedures Procedures (including critical care time)  Medications Ordered in ED Medications - No data to display   Initial Impression / Assessment and Plan / ED Course  I have reviewed the triage vital signs and the nursing notes.  Pertinent labs & imaging results that were available during my care of the patient were reviewed by me and considered in my medical decision making (see chart for details).  Fall with shoulder injury. Injury appears minor. She will be sent for x-rays. Also, will send for CT of head and cervical spine. Old records are reviewed, and she does have prior ED visits for falls.  X-rays do show fracture of the distal right clavicle and she is placed in a sling for comfort and referred to orthopedics  for follow-up. She is discharged with fall instructions and prescriptions are given for naproxen and oxycodone acetaminophen.  Final Clinical Impressions(s) / ED Diagnoses   Final diagnoses:  Fall in home, initial encounter  Closed displaced fracture of acromial end of right clavicle, initial encounter    New Prescriptions New Prescriptions   NAPROXEN (NAPROSYN) 250 MG TABLET    Take 1 tablet (250 mg total) by mouth 2 (two) times daily with a meal.   OXYCODONE-ACETAMINOPHEN (PERCOCET) 5-325 MG TABLET    Take 1 tablet by mouth every 4 (four) hours as needed for moderate pain.     [name], MD 01/30/17 [phone]

## 2017-02-02 ENCOUNTER — Emergency Department (HOSPITAL_COMMUNITY): Payer: Medicare Other

## 2017-02-02 ENCOUNTER — Encounter (HOSPITAL_COMMUNITY): Payer: Self-pay | Admitting: *Deleted

## 2017-02-02 ENCOUNTER — Emergency Department (HOSPITAL_COMMUNITY)
Admission: EM | Admit: 2017-02-02 | Discharge: 2017-02-02 | Disposition: A | Discharge status: Home / Self Care | Payer: Medicare Other | Attending: Emergency Medicine | Admitting: Emergency Medicine

## 2017-02-02 DIAGNOSIS — Z79899 Other long term (current) drug therapy: Secondary | ICD-10-CM | POA: Insufficient documentation

## 2017-02-02 DIAGNOSIS — I6782 Cerebral ischemia: Secondary | ICD-10-CM | POA: Diagnosis not present

## 2017-02-02 DIAGNOSIS — M25552 Pain in left hip: Secondary | ICD-10-CM | POA: Insufficient documentation

## 2017-02-02 DIAGNOSIS — M545 Low back pain: Secondary | ICD-10-CM | POA: Insufficient documentation

## 2017-02-02 DIAGNOSIS — M25551 Pain in right hip: Secondary | ICD-10-CM | POA: Insufficient documentation

## 2017-02-02 DIAGNOSIS — I110 Hypertensive heart disease with heart failure: Secondary | ICD-10-CM | POA: Insufficient documentation

## 2017-02-02 DIAGNOSIS — W19XXXA Unspecified fall, initial encounter: Secondary | ICD-10-CM

## 2017-02-02 DIAGNOSIS — I5033 Acute on chronic diastolic (congestive) heart failure: Secondary | ICD-10-CM | POA: Insufficient documentation

## 2017-02-02 DIAGNOSIS — Y92129 Unspecified place in nursing home as the place of occurrence of the external cause: Secondary | ICD-10-CM

## 2017-02-02 HISTORY — DX: Dependence on supplemental oxygen: Z99.81

## 2017-02-02 HISTORY — DX: Unspecified dementia, unspecified severity, without behavioral disturbance, psychotic disturbance, mood disturbance, and anxiety: F03.90

## 2017-02-02 LAB — CBC WITH DIFFERENTIAL/PLATELET
BASOS ABS: 0 10*3/uL (ref 0.0–0.1)
BASOS PCT: 0 %
Eosinophils Absolute: 0.3 10*3/uL (ref 0.0–0.7)
Eosinophils Relative: 4 %
HEMATOCRIT: 35.8 % — AB (ref 36.0–46.0)
HEMOGLOBIN: 12 g/dL (ref 12.0–15.0)
LYMPHS PCT: 25 %
Lymphs Abs: 1.7 10*3/uL (ref 0.7–4.0)
MCH: 29.9 pg (ref 26.0–34.0)
MCHC: 33.5 g/dL (ref 30.0–36.0)
MCV: 89.1 fL (ref 78.0–100.0)
MONO ABS: 0.5 10*3/uL (ref 0.1–1.0)
MONOS PCT: 8 %
NEUTROS ABS: 4.2 10*3/uL (ref 1.7–7.7)
NEUTROS PCT: 63 %
Platelets: 173 10*3/uL (ref 150–400)
RBC: 4.02 MIL/uL (ref 3.87–5.11)
RDW: 14.7 % (ref 11.5–15.5)
WBC: 6.6 10*3/uL (ref 4.0–10.5)

## 2017-02-02 LAB — BASIC METABOLIC PANEL
Anion gap: 6 (ref 5–15)
BUN: 29 mg/dL — AB (ref 6–20)
CHLORIDE: 109 mmol/L (ref 101–111)
CO2: 28 mmol/L (ref 22–32)
CREATININE: 0.89 mg/dL (ref 0.44–1.00)
Calcium: 8.9 mg/dL (ref 8.9–10.3)
GFR calc Af Amer: >60 mL/min (ref >60)
GFR calc non Af Amer: 56 mL/min — ABNORMAL LOW (ref >60)
Glucose, Bld: 130 mg/dL — ABNORMAL HIGH (ref 65–99)
POTASSIUM: 4.3 mmol/L (ref 3.5–5.1)
Sodium: 143 mmol/L (ref 135–145)

## 2017-02-02 LAB — URINALYSIS, ROUTINE W REFLEX MICROSCOPIC
BILIRUBIN URINE: NEGATIVE
Glucose, UA: NEGATIVE mg/dL
Hgb urine dipstick: NEGATIVE
Ketones, ur: NEGATIVE mg/dL
Leukocytes, UA: NEGATIVE
NITRITE: NEGATIVE
PH: 5 (ref 5.0–8.0)
Protein, ur: 30 mg/dL — AB
SPECIFIC GRAVITY, URINE: 1.019 (ref 1.005–1.030)

## 2017-02-02 LAB — LACTIC ACID, PLASMA: Lactic Acid, Venous: 1.1 mmol/L (ref 0.5–1.9)

## 2017-02-02 LAB — TROPONIN I: Troponin I: <0.03 ng/mL (ref <0.03)

## 2017-02-02 MED ORDER — NALOXONE HCL 0.4 MG/ML IJ SOLN
0.4000 mg | Freq: Once | INTRAMUSCULAR | Status: AC
  Administered 2017-02-02: 0.4 mg via INTRAVENOUS
  Filled 2017-02-02: qty 1

## 2017-02-02 NOTE — ED Notes (Signed)
Pt oxygen 88% on 2L. Increased to 4L and oxygen now 92%.

## 2017-02-02 NOTE — ED Notes (Signed)
Pt ambulated to bathroom with assistance from tech and son. Pt now in CT.

## 2017-02-02 NOTE — ED Triage Notes (Signed)
Pt comes in by EMS from [facility] [facility] [place]. Pt had a fall this morning while walking with her walker. Pt had a fall last week as well and fractured clavicle. Pt complaining of hip and back pain.

## 2017-02-02 NOTE — ED Provider Notes (Signed)
[facility]-EMER[facility]ENCY DEPT Provider Note   CSN: [id number] Arrival date & time: 02/02/17  4098     History   Chief Complaint Chief Complaint  Patient presents with  . Fall    HPI [name] [name] is a 83 y.o. female.  The history is provided by the patient, the EMS personnel and the nursing home. The history is limited by the condition of the patient (Hx dementia).  Pt was seen at 0845. Per EMS, NH report and pt: NH states pt fell while walking with her walker this morning. Pt states she "didn't have my oxygen on." Pt c/o hip and back pain to EMS, denies complaints currently. Pt with significant hx of dementia.    Past Medical History:  Diagnosis Date  . Chronic diastolic heart failure   . Dementia   . Essential hypertension, benign   . Gout   . Mixed hyperlipidemia   . On home O2   . Osteoarthritis   . Panic attacks   . Pulmonary hypertension    Moderate - secondary to diastolic dysfunction  . Symptomatic advanced heart block    Status post pacemaker placement  . Type 2 diabetes mellitus [facility])     Patient Active Problem List   Diagnosis Date Noted  . Dementia 05/31/2016  . Acute on chronic diastolic heart failure (HCC) 05/31/2016  . Second degree Mobitz II AV block 03/07/2013  . Dyslipidemia   . Mitral regurgitation   . Pulmonary hypertension   . Uncontrolled hypertension 12/08/2009  . Chronic diastolic heart failure (HCC) 12/08/2009  . PPM-St.Jude 12/08/2009    Past Surgical History:  Procedure Laterality Date  . ABDOMINAL HYSTERECTOMY    . BREAST BIOPSY    . HEMORRHOID SURGERY    . OVARIAN CYST REMOVAL    . PACEMAKER INSERTION  04/19/07   SJM Zephyr XL DR implanted by Dr Juanda Chance for syncope and mobitz II AV block    OB History    No data available       Home Medications    Prior to Admission medications   Medication Sig Start Date End Date Taking? Authorizing Provider  acetaminophen (TYLENOL) 325 MG tablet Take 650 mg by mouth every morning. As needed     Historical Provider, MD  acetaminophen (TYLENOL) 500 MG tablet Take 1,000 mg by mouth at bedtime as needed.    Historical Provider, MD  amLODipine (NORVASC) 5 MG tablet Take 1 tablet (5 mg total) by mouth daily. 06/01/16   Azalee Course, PA  CALCIUM PO Take 1 tablet by mouth daily.    Historical Provider, MD  carvedilol (COREG) 12.5 MG tablet Take 12.5 mg by mouth 2 (two) times daily.    Historical Provider, MD  cephALEXin (KEFLEX) 500 MG capsule Take 1 capsule (500 mg total) by mouth 4 (four) times daily. 12/27/16   [name], MD  Cholecalciferol (VITAMIN D-3) 1000 UNITS CAPS Take 1 capsule by mouth daily.    Historical Provider, MD  clonazePAM (KLONOPIN) 0.5 MG tablet Take 0.5 mg by mouth 2 (two) times daily as needed for anxiety. One tablet at 0700 and 1 tablet at 1300.    Historical Provider, MD  clonazePAM (KLONOPIN) 1 MG tablet Take 1 mg by mouth at bedtime.    Historical Provider, MD  Cranberry 450 MG TABS Take 1 tablet by mouth 2 (two) times daily.    Historical Provider, MD  donepezil (ARICEPT) 10 MG tablet Take 10 mg by mouth at bedtime.    Historical Provider, MD  DULoxetine (CYMBALTA) 20 MG capsule Take 20 mg by mouth daily.    Historical Provider, MD  furosemide (LASIX) 20 MG tablet Take 20 mg by mouth 2 (two) times daily.     Historical Provider, MD  levothyroxine (SYNTHROID, LEVOTHROID) 25 MCG tablet Take 25 mcg by mouth daily before breakfast.    Historical Provider, MD  lisinopril (PRINIVIL,ZESTRIL) 20 MG tablet Take 1.5 tablets (30 mg total) by mouth daily. Patient taking differently: Take 20 mg by mouth daily.  09/18/15   Jonelle SidleSamuel G McDowell, MD  loperamide (IMODIUM) 2 MG capsule Take 2 mg by mouth every 6 (six) hours as needed. diarrhea 07/08/16   Historical Provider, MD  metFORMIN (GLUCOPHAGE) 500 MG tablet Take 500-1,000 mg by mouth 2 (two) times daily with a meal. Takes 2 in AM and 1 in PM    Historical Provider, MD  Multiple Vitamin (MULTIVITAMIN) tablet Take 1 tablet by mouth daily.     Historical Provider, MD  naproxen (NAPROSYN) 250 MG tablet Take 1 tablet (250 mg total) by mouth 2 (two) times daily with a meal. 01/30/17   Dione Boozeavid Glick, MD  oxyCODONE-acetaminophen (PERCOCET) 5-325 MG tablet Take 1 tablet by mouth every 4 (four) hours as needed for moderate pain. 01/30/17   Dione Boozeavid Glick, MD  OXYGEN Inhale 2 L into the lungs daily.    Historical Provider, MD  Potassium Chloride CR (MICRO-K) 8 MEQ CPCR capsule CR Take 2 capsules by mouth daily.  08/21/15   Historical Provider, MD  pravastatin (PRAVACHOL) 40 MG tablet Take 40 mg by mouth daily.      Historical Provider, MD    Family History No family history on file.  Social History Social History  Substance Use Topics  . Smoking status: Never Smoker  . Smokeless tobacco: Never Used  . Alcohol use No     Allergies   Patient has no known allergies.   Review of Systems Review of Systems  Unable to perform ROS: Dementia     Physical Exam Updated Vital Signs BP (!) 109/54 (BP Location: Left Arm)   Pulse (!) 50   Temp 97.9 F (36.6 C) (Oral)   Resp 12   Ht 5\' 6"  (1.676 m)   Wt 180 lb (81.6 kg)   SpO2 92%   BMI 29.05 kg/m   10:45 Orthostatic Vital Signs DL  Orthostatic Lying   BP- Lying: 134/71  Pulse- Lying: 50      Orthostatic Sitting  BP- Sitting: 136/72  Pulse- Sitting: 50      Orthostatic Standing at 0 minutes  BP- Standing at 0 minutes: 120/58  Pulse- Standing at 0 minutes: 50   Patient Vitals for the past 24 hrs:  BP Temp Temp src Pulse Resp SpO2 Height Weight  02/02/17 1230 (!) 137/37 - - (!) 52 19 94 % - -  02/02/17 1215 - - - (!) 54 16 95 % - -  02/02/17 1200 125/71 - - (!) 47 11 95 % - -  02/02/17 1149 138/56 - - (!) 52 15 95 % - -  02/02/17 1045 - - - (!) 50 13 95 % - -  02/02/17 1030 131/75 - - (!) 51 15 100 % - -  02/02/17 1000 135/70 - - (!) 50 15 96 % - -  02/02/17 0900 129/62 - - (!) 49 20 96 % - -  02/02/17 0839 (!) 109/54 97.9 F (36.6 C) Oral (!) 50 12 92 % - -  02/02/17  16100833 - - - - - -  5\' 6"  (1.676 m) 180 lb (81.6 kg)      Physical Exam 0850: Physical examination:  Nursing notes reviewed; Vital signs and O2 SAT reviewed;  Constitutional: Well developed, Well nourished, Well hydrated, In no acute distress; Head:  Normocephalic, atraumatic; Eyes: EOMI, PERRL, No scleral icterus; ENMT: Mouth and pharynx normal, Mucous membranes moist; Neck: Supple, Full range of motion, No lymphadenopathy; Cardiovascular: Regular rate and rhythm, No gallop; Respiratory: Breath sounds clear & equal bilaterally, No wheezes.  Speaking full sentences with ease, Normal respiratory effort/excursion; Chest: Nontender, Movement normal; Abdomen: Soft, Nontender, Nondistended, Normal bowel sounds; Genitourinary: No CVA tenderness; Spine:  No midline CS, TS, LS tenderness.;; Extremities: Pulses normal, No tenderness, No edema, No calf edema or asymmetry.; Neuro: Sleeping, awakens to her name, then falls asleep again. Confused per hx dementia. No facial droop. Major CN grossly intact.  Speech clear. +right arm in sling, otherwise moves all extremities on stretcher spontaneously and to command..; Skin: Color normal, Warm, Dry.   ED Treatments / Results  Labs (all labs ordered are listed, but only abnormal results are displayed)   EKG  EKG Interpretation  Date/Time:  Wednesday February 02 2017 08:43:06 EST Ventricular Rate:  50 PR Interval:    QRS Duration: 183 QT Interval:  505 QTC Calculation: 461 R Axis:   -81 Text Interpretation:  Ventricular-paced rhythm LVH with IVCD, LAD and secondary repol abnrm Baseline wander When compared with ECG of 12/27/2016 No significant change was found Confirmed by [facility]  MD, Nicholos Johns [id number]) on 02/02/2017 9:30:58 AM       Radiology   Procedures Procedures (including critical care time)  Medications Ordered in ED Medications - No data to display   Initial Impression / Assessment and Plan / ED Course  I have reviewed the triage vital signs  and the nursing notes.  Pertinent labs & imaging results that were available during my care of the patient were reviewed by me and considered in my medical decision making (see chart for details).  MDM Reviewed: previous chart, nursing note and vitals Reviewed previous: labs and ECG Interpretation: ECG, labs, x-ray and CT scan   Results for orders placed or performed during the hospital encounter of 02/02/17  Urinalysis, Routine w reflex microscopic  Result Value Ref Range   Color, Urine YELLOW YELLOW   APPearance CLEAR CLEAR   Specific Gravity, Urine 1.019 1.005 - 1.030   pH 5.0 5.0 - 8.0   Glucose, UA NEGATIVE NEGATIVE mg/dL   Hgb urine dipstick NEGATIVE NEGATIVE   Bilirubin Urine NEGATIVE NEGATIVE   Ketones, ur NEGATIVE NEGATIVE mg/dL   Protein, ur 30 (A) NEGATIVE mg/dL   Nitrite NEGATIVE NEGATIVE   Leukocytes, UA NEGATIVE NEGATIVE   RBC / HPF 0-5 0 - 5 RBC/hpf   WBC, UA 0-5 0 - 5 WBC/hpf   Bacteria, UA RARE (A) NONE SEEN   Hyaline Casts, UA PRESENT   Basic metabolic panel  Result Value Ref Range   Sodium 143 135 - 145 mmol/L   Potassium 4.3 3.5 - 5.1 mmol/L   Chloride 109 101 - 111 mmol/L   CO2 28 22 - 32 mmol/L   Glucose, Bld 130 (H) 65 - 99 mg/dL   BUN 29 (H) 6 - 20 mg/dL   Creatinine, Ser 6.04 0.44 - 1.00 mg/dL   Calcium 8.9 8.9 - 54.0 mg/dL   GFR calc non Af Amer 56 (L) >60 mL/min   GFR calc Af Amer >60 >60 mL/min   Anion gap 6 5 -  15  Troponin I  Result Value Ref Range   Troponin I <0.03 <0.03 ng/mL  Lactic acid, plasma  Result Value Ref Range   Lactic Acid, Venous 1.1 0.5 - 1.9 mmol/L  CBC with Differential  Result Value Ref Range   WBC 6.6 4.0 - 10.5 K/uL   RBC 4.02 3.87 - 5.11 MIL/uL   Hemoglobin 12.0 12.0 - 15.0 g/dL   HCT 16.1 (L) 09.6 - 04.5 %   MCV 89.1 78.0 - 100.0 fL   MCH 29.9 26.0 - 34.0 pg   MCHC 33.5 30.0 - 36.0 g/dL   RDW 40.9 81.1 - 91.4 %   Platelets 173 150 - 400 K/uL   Neutrophils Relative % 63 %   Neutro Abs 4.2 1.7 - 7.7 K/uL    Lymphocytes Relative 25 %   Lymphs Abs 1.7 0.7 - 4.0 K/uL   Monocytes Relative 8 %   Monocytes Absolute 0.5 0.1 - 1.0 K/uL   Eosinophils Relative 4 %   Eosinophils Absolute 0.3 0.0 - 0.7 K/uL   Basophils Relative 0 %   Basophils Absolute 0.0 0.0 - 0.1 K/uL   Dg Chest 1 View Result Date: 02/02/2017 CLINICAL DATA:  Recent fall with low back pain, dementia EXAM: CHEST 1 VIEW COMPARISON:  Portable chest x-ray of 12/27/2016 FINDINGS: No active infiltrate or effusion is seen. No pneumothorax is noted. Moderate cardiomegaly is stable. A dual lead permanent pacemaker remains. No acute bony abnormality is seen. IMPRESSION: Stable cardiomegaly with permanent pacemaker. No active lung disease. Electronically Signed   By: Dwyane Dee M.D.   On: 02/02/2017 09:55   Dg Lumbar Spine Complete Result Date: 02/02/2017 CLINICAL DATA:  Recent fall, low back pain and pain, dementia EXAM: LUMBAR SPINE - COMPLETE 4+ VIEW COMPARISON:  None. FINDINGS: The lumbar vertebrae are in normal alignment. The bones are diffusely osteopenic. Degenerative disc disease is present at L1-2, L2-3, and L5-S1 levels, where there is loss of disc space and sclerosis with spurring. No compression deformity is seen. The SI joints are corticated. IMPRESSION: 1. Normal alignment with degenerative disc disease at L1-2, L2-3, and L5-S1. 2. No compression deformity. Electronically Signed   By: Dwyane Dee M.D.   On: 02/02/2017 09:56   Ct Head Wo Contrast Result Date: 02/02/2017 CLINICAL DATA:  Fall this morning with pain, initial encounter EXAM: CT HEAD WITHOUT CONTRAST CT CERVICAL SPINE WITHOUT CONTRAST TECHNIQUE: Multidetector CT imaging of the head and cervical spine was performed following the standard protocol without intravenous contrast. Multiplanar CT image reconstructions of the cervical spine were also generated. COMPARISON:  01/30/2017, 12/23/2011 FINDINGS: CT HEAD FINDINGS Brain: Mild atrophic changes are noted. Chronic white matter ischemic  changes are seen. No findings to suggest acute hemorrhage, acute infarction or space-occupying mass lesion are noted. Vascular: No hyperdense vessel or unexpected calcification. Skull: Normal. Negative for fracture or focal lesion. Sinuses/Orbits: No acute finding. Other: None. CT CERVICAL SPINE FINDINGS Alignment: Anterolisthesis of C4 on C5 is noted of a degenerative nature. Skull base and vertebrae: 7 cervical segments are well visualized. Vertebral body height is well maintained. Facet hypertrophic changes are noted. No acute fracture or acute facet abnormality is seen. Rounded hypodensity is noted in the midportion of the T3 vertebral body which is unchanged from a prior exam of 2012. This likely represents a hemangioma. Soft tissues and spinal canal: Within normal limits. Disc levels: Multilevel disc space narrowing is noted particularly at C3-4 and C5-6 and C6-7. Osteophytic changes are seen. Upper chest: Within  normal limits. IMPRESSION: CT of the head: No acute intracranial abnormality is noted. Chronic atrophic and ischemic changes are seen. CT of the cervical spine: Multilevel degenerative change without acute abnormality. Electronically Signed   By: Alcide Clever M.D.   On: 02/02/2017 11:37   Ct Cervical Spine Wo Contrast Result Date: 02/02/2017 CLINICAL DATA:  Fall this morning with pain, initial encounter EXAM: CT HEAD WITHOUT CONTRAST CT CERVICAL SPINE WITHOUT CONTRAST TECHNIQUE: Multidetector CT imaging of the head and cervical spine was performed following the standard protocol without intravenous contrast. Multiplanar CT image reconstructions of the cervical spine were also generated. COMPARISON:  01/30/2017, 12/23/2011 FINDINGS: CT HEAD FINDINGS Brain: Mild atrophic changes are noted. Chronic white matter ischemic changes are seen. No findings to suggest acute hemorrhage, acute infarction or space-occupying mass lesion are noted. Vascular: No hyperdense vessel or unexpected calcification. Skull:  Normal. Negative for fracture or focal lesion. Sinuses/Orbits: No acute finding. Other: None. CT CERVICAL SPINE FINDINGS Alignment: Anterolisthesis of C4 on C5 is noted of a degenerative nature. Skull base and vertebrae: 7 cervical segments are well visualized. Vertebral body height is well maintained. Facet hypertrophic changes are noted. No acute fracture or acute facet abnormality is seen. Rounded hypodensity is noted in the midportion of the T3 vertebral body which is unchanged from a prior exam of 2012. This likely represents a hemangioma. Soft tissues and spinal canal: Within normal limits. Disc levels: Multilevel disc space narrowing is noted particularly at C3-4 and C5-6 and C6-7. Osteophytic changes are seen. Upper chest: Within normal limits. IMPRESSION: CT of the head: No acute intracranial abnormality is noted. Chronic atrophic and ischemic changes are seen. CT of the cervical spine: Multilevel degenerative change without acute abnormality. Electronically Signed   By: Alcide Clever M.D.   On: 02/02/2017 11:37    Dg Hips Bilat W Or Wo Pelvis 3-4 Views Result Date: 02/02/2017 CLINICAL DATA:  Recent fall, low back and hip pain, dementia EXAM: DG HIP (WITH OR WITHOUT PELVIS) 3-4V BILAT COMPARISON:  None. FINDINGS: Only mild degenerative joint disease of the hips is present for age. No acute fracture is seen. The pelvic rami are intact. The SI joints appear corticated. IMPRESSION: No acute fracture.  Only mild degenerative change of the hips. Electronically Signed   By: Dwyane Dee M.D.   On: 02/02/2017 09:59    1300:  Pt sleepy on arrival; IV narcan given with good effect. Pt is not orthostatic on VS. Pt has ambulated with baseline gait, easy resps, NAD. Will d/c back to NH. Dx and testing d/w pt and family.  Questions answered.  Verb understanding, agreeable to d/c nursing home with outpt f/u.   Final Clinical Impressions(s) / ED Diagnoses   Final diagnoses:  None    New Prescriptions New  Prescriptions   No medications on file      Samuel Jester, DO 02/05/17 2131

## 2017-02-02 NOTE — Discharge Instructions (Signed)
Take your usual prescriptions as previously directed.  Call your regular medical doctor today to schedule a follow up appointment within the next 2 days.  Return to the Emergency Department immediately sooner if worsening.  ° °

## 2017-02-04 LAB — URINE CULTURE: Culture: >=100000 — AB

## 2017-02-05 ENCOUNTER — Telehealth: Payer: Self-pay

## 2017-02-05 NOTE — Telephone Encounter (Signed)
[facility] [place] [phone]

## 2017-02-05 NOTE — Progress Notes (Signed)
ED Antimicrobial Stewardship Positive Culture Follow Up   [name] GarnetMaggie W Meriwether is an 83 y.o. female [facility] with a chief complaint of  Chief Complaint  Patient presents with  . Fall    Recent Results (from the past 720 hour(s))  Urine culture     Status: Abnormal   Collection Time: 02/02/17  8:52 AM  Result Value Ref Range Status   Specimen Description URINE, CATHETERIZED  Final   Special Requests NONE  Final   Culture >=100,000 COLONIES/mL ENTEROCOCCUS FAECALIS (A)  Final   Report Status 02/04/2017 FINAL  Final   Organism ID, Bacteria ENTEROCOCCUS FAECALIS (A)  Final      Susceptibility   Enterococcus faecalis - MIC*    AMPICILLIN <=2 SENSITIVE Sensitive     LEVOFLOXACIN >=8 RESISTANT Resistant     NITROFURANTOIN <=16 SENSITIVE Sensitive     VANCOMYCIN 1 SENSITIVE Sensitive     * >=100,000 COLONIES/mL ENTEROCOCCUS FAECALIS    []  Treated with N/A, organism resistant to prescribed antimicrobial [x]  Patient discharged originally without antimicrobial agent and treatment is now indicated  New antibiotic prescription: IF symptomatic, amoxicillin 500mg  PO BID x 7 days  ED Provider: Fayrene HelperBowie Tran, PA   Kearah Gayden, Drake Leachachel Lynn 02/05/2017, 10:42 AM Clinical Pharmacist Phone# [phone]

## 2017-02-19 ENCOUNTER — Encounter (HOSPITAL_COMMUNITY): Payer: Self-pay | Admitting: Emergency Medicine

## 2017-02-19 ENCOUNTER — Emergency Department (HOSPITAL_COMMUNITY)
Admission: EM | Admit: 2017-02-19 | Discharge: 2017-02-19 | Disposition: A | Discharge status: Home / Self Care | Payer: Medicare Other | Attending: Emergency Medicine | Admitting: Emergency Medicine

## 2017-02-19 DIAGNOSIS — E119 Type 2 diabetes mellitus without complications: Secondary | ICD-10-CM | POA: Diagnosis not present

## 2017-02-19 DIAGNOSIS — R4689 Other symptoms and signs involving appearance and behavior: Secondary | ICD-10-CM

## 2017-02-19 DIAGNOSIS — Z79899 Other long term (current) drug therapy: Secondary | ICD-10-CM | POA: Diagnosis not present

## 2017-02-19 DIAGNOSIS — I5033 Acute on chronic diastolic (congestive) heart failure: Secondary | ICD-10-CM | POA: Diagnosis not present

## 2017-02-19 DIAGNOSIS — F918 Other conduct disorders: Secondary | ICD-10-CM | POA: Insufficient documentation

## 2017-02-19 DIAGNOSIS — I110 Hypertensive heart disease with heart failure: Secondary | ICD-10-CM | POA: Diagnosis not present

## 2017-02-19 NOTE — ED Notes (Signed)
Attempting to give report to Schering-PloughCrystal, [facility] fundraiserN [facility]. [facility] RN states she will have to speak with her director to see if they will accept pt back at facility.

## 2017-02-19 NOTE — ED Notes (Signed)
Report given to Crystal, RN at this time.
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
## 2017-02-19 NOTE — ED Notes (Signed)
Pt is calm and cooperative.  Resting at this time.

## 2017-02-19 NOTE — ED Notes (Signed)
Pt resting with eyes closed, NAD noted. Went into room to re-take VS pt is calm and cooperative at this time.

## 2017-02-19 NOTE — ED Notes (Signed)
Pt is resting with eyes closed, NAD noted, calm and cooperative at this time.
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
## 2017-02-19 NOTE — Discharge Instructions (Signed)
As discussed, your evaluation today has been largely reassuring.  But, it is important that you monitor your condition carefully, and do not hesitate to return to the ED if you develop new, or concerning changes in your condition. ? ?Otherwise, please follow-up with your physician for appropriate ongoing care. ? ?

## 2017-02-19 NOTE — ED Notes (Signed)
[facility] to [facility] at this time, no answer.
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
## 2017-02-22 ENCOUNTER — Ambulatory Visit: Payer: Medicare Other | Admitting: Orthopedic Surgery

## 2017-02-23 ENCOUNTER — Telehealth: Payer: Self-pay | Admitting: Orthopedic Surgery

## 2017-02-23 NOTE — Telephone Encounter (Signed)
Patient's son [name] [name] called and stated that he received a message stating  "Someone in his residence [facility] Friday, March 3rd at 10:10.   He said this appointment had to be for his mother, [name] [name] but that she was staying at [name].  He said he was unsure of this appointment but he would check with [facility].

## 2017-02-25 ENCOUNTER — Encounter: Payer: Self-pay | Admitting: Orthopedic Surgery

## 2017-02-25 ENCOUNTER — Ambulatory Visit: Payer: Medicare Other | Admitting: Orthopedic Surgery

## 2017-07-29 ENCOUNTER — Ambulatory Visit (INDEPENDENT_AMBULATORY_CARE_PROVIDER_SITE_OTHER): Payer: Medicare Other | Admitting: Internal Medicine

## 2017-07-29 ENCOUNTER — Encounter: Payer: Self-pay | Admitting: Internal Medicine

## 2017-07-29 VITALS — BP 116/68 | HR 49 | Ht 64.0 in

## 2017-07-29 DIAGNOSIS — I5032 Chronic diastolic (congestive) heart failure: Secondary | ICD-10-CM

## 2017-07-29 DIAGNOSIS — I442 Atrioventricular block, complete: Secondary | ICD-10-CM | POA: Diagnosis not present

## 2017-07-29 DIAGNOSIS — I10 Essential (primary) hypertension: Secondary | ICD-10-CM

## 2017-07-29 DIAGNOSIS — I441 Atrioventricular block, second degree: Secondary | ICD-10-CM

## 2017-07-29 LAB — CUP PACEART INCLINIC DEVICE CHECK
Implantable Lead Implant Date: 20080423
Implantable Lead Location: 753859
Implantable Pulse Generator Implant Date: 20080423
Lead Channel Impedance Value: 315 Ohm
Lead Channel Pacing Threshold Amplitude: 0.75 V
Lead Channel Pacing Threshold Pulse Width: 0.4 ms
Lead Channel Sensing Intrinsic Amplitude: 1.1 mV
Lead Channel Setting Pacing Amplitude: 2 V
Lead Channel Setting Pacing Pulse Width: 0.4 ms
MDC IDC LEAD IMPLANT DT: 20080423
MDC IDC LEAD LOCATION: 753860
MDC IDC MSMT BATTERY IMPEDANCE: 3500 Ohm
MDC IDC MSMT BATTERY VOLTAGE: 2.73 V
MDC IDC MSMT LEADCHNL RA PACING THRESHOLD PULSEWIDTH: 0.4 ms
MDC IDC MSMT LEADCHNL RV IMPEDANCE VALUE: 286 Ohm
MDC IDC MSMT LEADCHNL RV PACING THRESHOLD AMPLITUDE: 0.75 V
MDC IDC SESS DTM: 20180803123044
MDC IDC SET LEADCHNL RV SENSING SENSITIVITY: 1.5 mV
MDC IDC STAT BRADY RA PERCENT PACED: 23 %
MDC IDC STAT BRADY RV PERCENT PACED: 99 %
Pulse Gen Serial Number: 1898220

## 2017-07-29 NOTE — Progress Notes (Signed)
PCP: Louie Bostonapper, David B., MD  Primary EP:  Dr Christoper AllegraAllred  [name] [name] is a 83 y.o. female who presents today for routine electrophysiology followup.  She has advanced dementia.  I have not seen her in 2 years.  She is without compliant today but unable to provide real history.  She states "I dont hurt no where".   Past Medical History:  Diagnosis Date  . Chronic diastolic heart failure   . Dementia   . Essential hypertension, benign   . Gout   . Mixed hyperlipidemia   . On home O2   . Osteoarthritis   . Panic attacks   . Pulmonary hypertension (HCC)    Moderate - secondary to diastolic dysfunction  . Symptomatic advanced heart block    Status post pacemaker placement  . Type 2 diabetes mellitus (HCC)    Past Surgical History:  Procedure Laterality Date  . ABDOMINAL HYSTERECTOMY    . BREAST BIOPSY    . HEMORRHOID SURGERY    . OVARIAN CYST REMOVAL    . PACEMAKER INSERTION  04/19/07   SJM Zephyr XL DR implanted by Dr Juanda ChanceBrodie for syncope and mobitz II AV block    ROS- all systems are reviewed and negative except as per HPI above  Current Outpatient Prescriptions  Medication Sig Dispense Refill  . acetaminophen (TYLENOL) 325 MG tablet Take 650 mg by mouth every morning. As needed    . amLODipine (NORVASC) 5 MG tablet Take 1 tablet (5 mg total) by mouth daily. 90 tablet 3  . carvedilol (COREG) 12.5 MG tablet Take 12.5 mg by mouth 2 (two) times daily.    . clonazePAM (KLONOPIN) 0.5 MG tablet Take 0.5 mg by mouth 2 (two) times daily as needed for anxiety. One tablet at 0700 and 1 tablet at 1300.    . clonazePAM (KLONOPIN) 1 MG tablet Take 1 mg by mouth at bedtime.    . Cranberry 450 MG TABS Take 1 tablet by mouth 2 (two) times daily.    . divalproex (DEPAKOTE) 250 MG DR tablet Take 250 mg by mouth 3 (three) times daily.    Marland Kitchen. donepezil (ARICEPT) 10 MG tablet Take 10 mg by mouth at bedtime.    Marland Kitchen. escitalopram (LEXAPRO) 5 MG tablet Take 1 tablet by mouth daily at 6 (six) AM.    .  furosemide (LASIX) 40 MG tablet Take 40 mg by mouth.    Marland Kitchen. HYDROcodone-acetaminophen (NORCO/VICODIN) 5-325 MG tablet Take 1 tablet by mouth every 6 (six) hours as needed for moderate pain.    Marland Kitchen. lisinopril (PRINIVIL,ZESTRIL) 10 MG tablet Take 1 tablet by mouth daily at 6 (six) AM.    . loperamide (IMODIUM) 2 MG capsule Take 2 mg by mouth every 6 (six) hours as needed. diarrhea    . memantine (NAMENDA) 10 MG tablet Take 10 mg by mouth 2 (two) times daily.    . metFORMIN (GLUCOPHAGE) 500 MG tablet Take 500-1,000 mg by mouth 2 (two) times daily with a meal. Takes 2 in AM and 1 in PM    . Multiple Vitamin (MULTIVITAMIN) tablet Take 1 tablet by mouth daily.    . OXcarbazepine (TRILEPTAL) 150 MG tablet Take 150 mg by mouth 2 (two) times daily.    . OXYGEN Inhale 2 L into the lungs daily.    . Potassium Chloride CR (MICRO-K) 8 MEQ CPCR capsule CR Take 2 capsules by mouth daily.     . QUEtiapine (SEROQUEL) 100 MG tablet Take 100 mg  by mouth at bedtime.    . saccharomyces boulardii (FLORASTOR) 250 MG capsule Take 250 mg by mouth daily at 6 (six) AM.    . trimethoprim (TRIMPEX) 100 MG tablet Take 1 tablet by mouth at bedtime.     No current facility-administered medications for this visit.     Physical Exam: Vitals:   07/29/17 1148  BP: 116/68  Pulse: (!) 49  SpO2: (!) 87%  Height: 5\' 4"  (1.626 m)    GEN- The patient is elderly appearing, alert but confused Head- normocephalic, atraumatic Eyes-  Sclera clear, conjunctiva pink Ears- hearing reduced Oropharynx- clear Lungs- Clear to ausculation bilaterally, normal work of breathing Chest- pacemaker pocket is well healed Heart- Regular rate and rhythm  GI- soft, NT, ND, + BS Extremities- no clubbing, cyanosis, or edema  Pacemaker interrogation- reviewed in detail today,  See PACEART report   Assessment and Plan:  1. Complete heart block Device dependant today 2-4 years remain on battery Normal pacemaker function See Pace Art report No  changes today  2. HTN Stable No change required today  3. Chronic diastolic dysfunction Stable No change required today   Device clinic in 6 months I will see in a year  Hillis RangeJames Allred MD, [facility] 07/29/2017 12:09 PM

## 2017-07-29 NOTE — Patient Instructions (Addendum)
Medication Instructions:  Continue all current medications.  Labwork: none  Testing/Procedures: none  Follow-Up:  Your physician wants you to follow up in:  1 year.  You will receive a reminder letter in the mail one-two months in advance.  If you don't receive a letter, please call our office to schedule the follow up appointment.  (DR. ALLRED)  Your physician wants you to follow up in: 6 months.  You will receive a reminder letter in the mail one-two months in advance.  If you don't receive a letter, please call our office to schedule the follow up appointment.  (DEVICE CLINIC)  Any Other Special Instructions Will Be Listed Below (If Applicable).  If you need a refill on your cardiac medications before your next appointment, please call your pharmacy.

## 2018-01-27 ENCOUNTER — Ambulatory Visit (INDEPENDENT_AMBULATORY_CARE_PROVIDER_SITE_OTHER): Payer: Medicare Other | Admitting: *Deleted

## 2018-01-27 DIAGNOSIS — Z950 Presence of cardiac pacemaker: Secondary | ICD-10-CM

## 2018-01-27 DIAGNOSIS — I442 Atrioventricular block, complete: Secondary | ICD-10-CM

## 2018-01-27 LAB — CUP PACEART INCLINIC DEVICE CHECK
Battery Voltage: 2.72 V
Date Time Interrogation Session: 20190201164608
Implantable Lead Implant Date: 20080423
Implantable Lead Location: 753860
Implantable Pulse Generator Implant Date: 20080423
Lead Channel Pacing Threshold Pulse Width: 0.4 ms
Lead Channel Pacing Threshold Pulse Width: 0.4 ms
Lead Channel Setting Pacing Amplitude: 2 V
Lead Channel Setting Pacing Pulse Width: 0.4 ms
MDC IDC LEAD IMPLANT DT: 20080423
MDC IDC LEAD LOCATION: 753859
MDC IDC MSMT BATTERY IMPEDANCE: 5300 Ohm
MDC IDC MSMT LEADCHNL RA IMPEDANCE VALUE: 332 Ohm
MDC IDC MSMT LEADCHNL RA PACING THRESHOLD AMPLITUDE: 0.5 V
MDC IDC MSMT LEADCHNL RA SENSING INTR AMPL: 1.5 mV
MDC IDC MSMT LEADCHNL RV IMPEDANCE VALUE: 289 Ohm
MDC IDC MSMT LEADCHNL RV PACING THRESHOLD AMPLITUDE: 0.875 V
MDC IDC PG SERIAL: 1898220
MDC IDC SET LEADCHNL RV SENSING SENSITIVITY: 1.5 mV
MDC IDC STAT BRADY RA PERCENT PACED: 22 %
MDC IDC STAT BRADY RV PERCENT PACED: 99 % — AB
Pulse Gen Model: 5826

## 2018-01-27 NOTE — Progress Notes (Signed)
Pacemaker check in clinic. Normal device function. Thresholds, sensing, impedances consistent with previous measurements. Device programmed to maximize longevity. No mode switches. No episode triggers enabled. Device programmed at appropriate safety margins. Histogram distribution appropriate for patient activity level. Device programmed to optimize intrinsic conduction. Estimated longevity 1.75-2.5 years. Patient education completed. ROV with JA/E on 09/01/18.

## 2018-08-20 IMAGING — CT CT HEAD W/O CM
3 series · 16 of 47 positions shown, 19 images · non-contrast
Comparison: 10/14/2013

CLINICAL DATA: Altered mental status.

EXAM:
CT HEAD WITHOUT CONTRAST
TECHNIQUE: Contiguous axial images were obtained from the base of the skull
through the vertex without intravenous contrast.

[Series 2: head wo · axial · 0.43mm/px · z∈[-67,+78]mm · 10 of 35 slices shown, 13 images]
[im 3/35  brain]
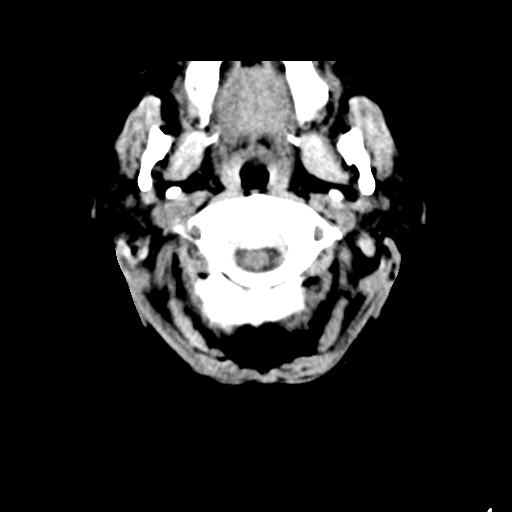
[im 3/35  bone]
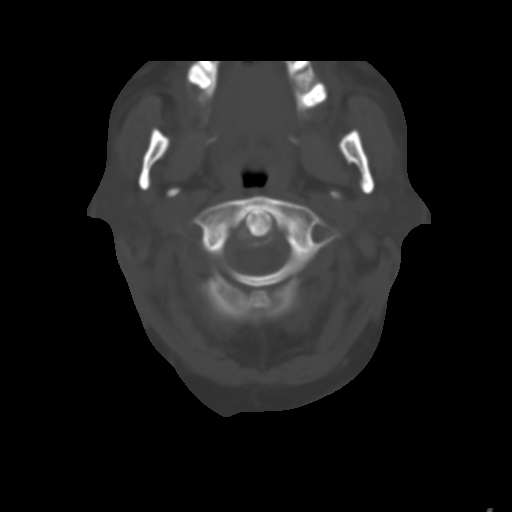
[im 6/35  brain]
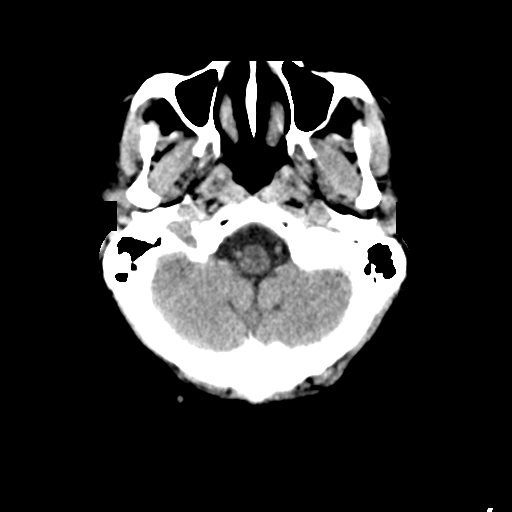
[im 10/35  brain]
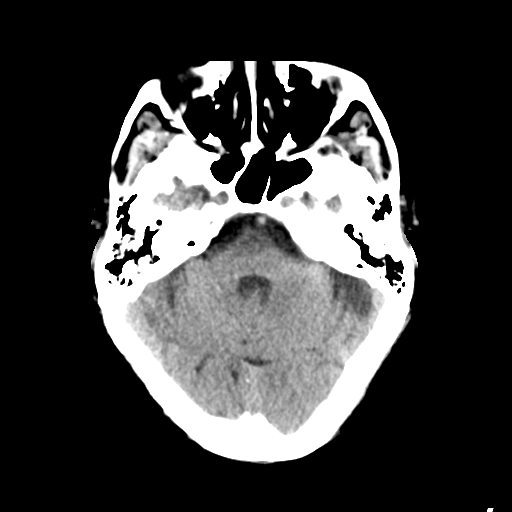
[im 12/35  brain]
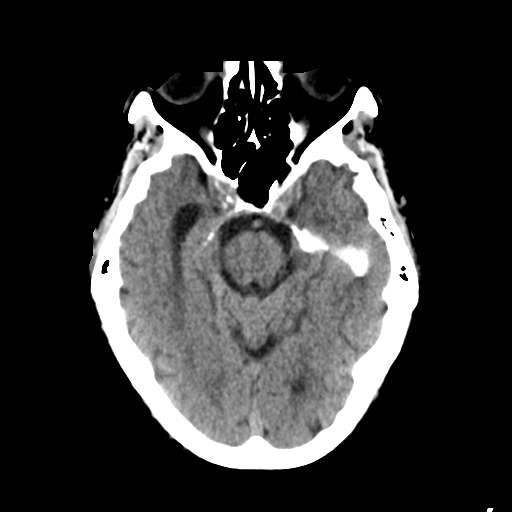
[im 16/35  brain]
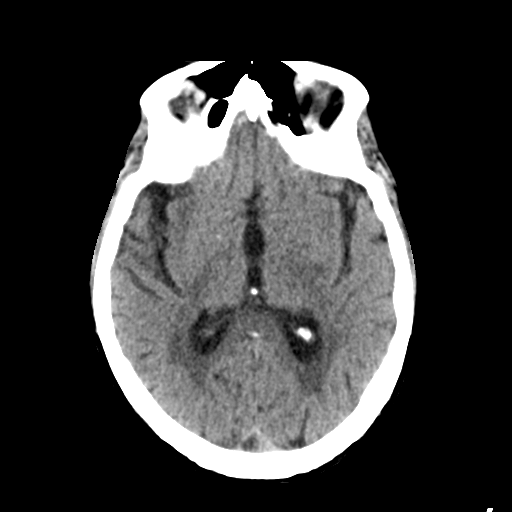
[im 16/35  bone]
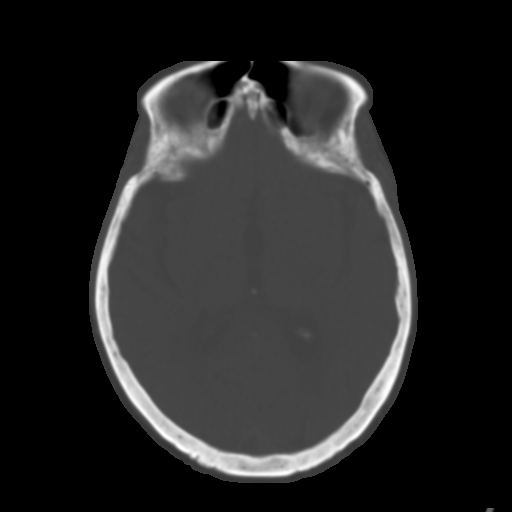
[im 19/35  brain]
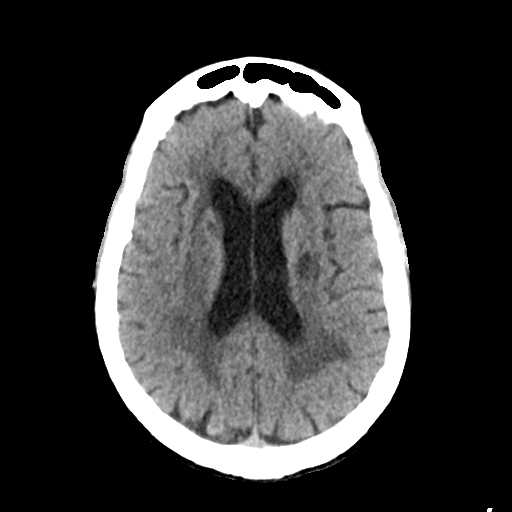
[im 23/35  brain]
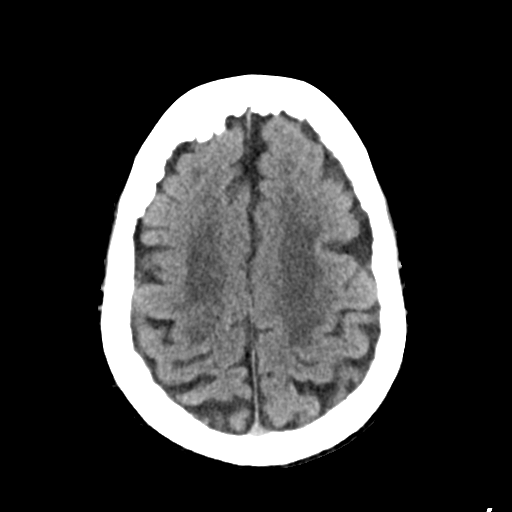
[im 26/35  brain]
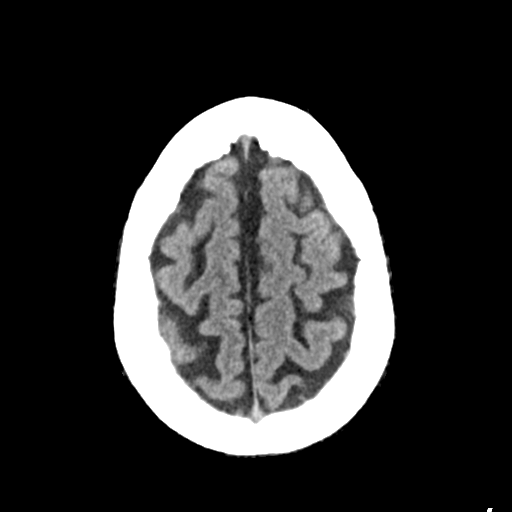
[im 29/35  brain]
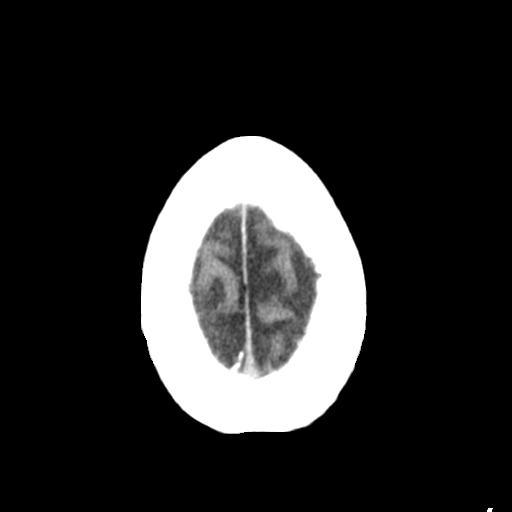
[im 29/35  bone]
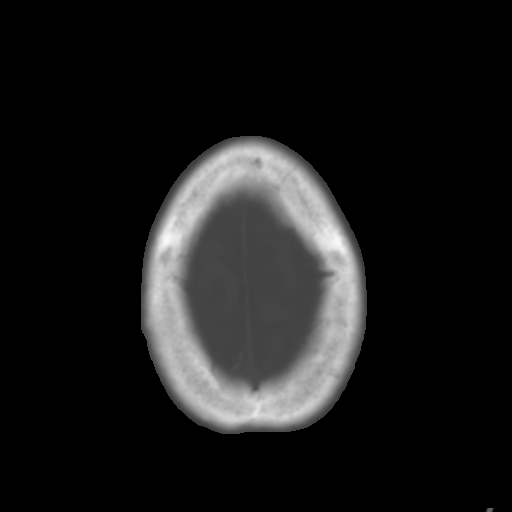
[im 32/35  brain]
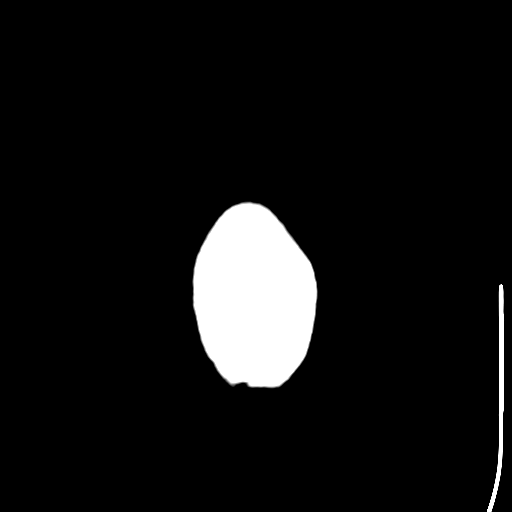

[Series 4: coronal soft tissue · coronal · 0.32mm/px · 3 of 65 slices shown]
[im 22/65  brain]
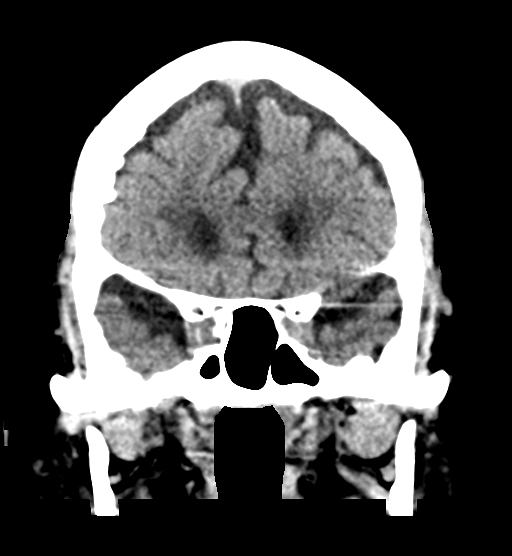
[im 29/65  brain]
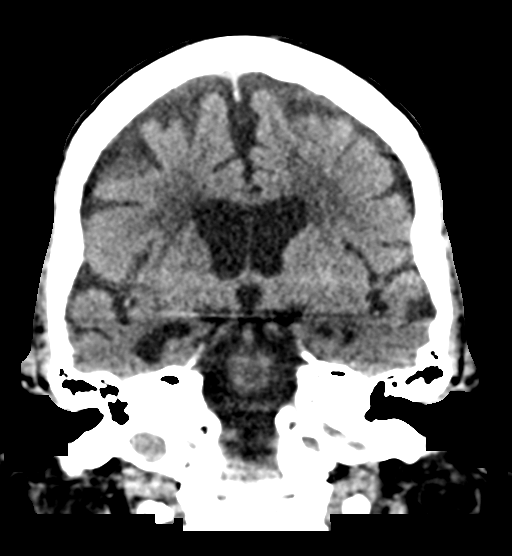
[im 36/65  brain]
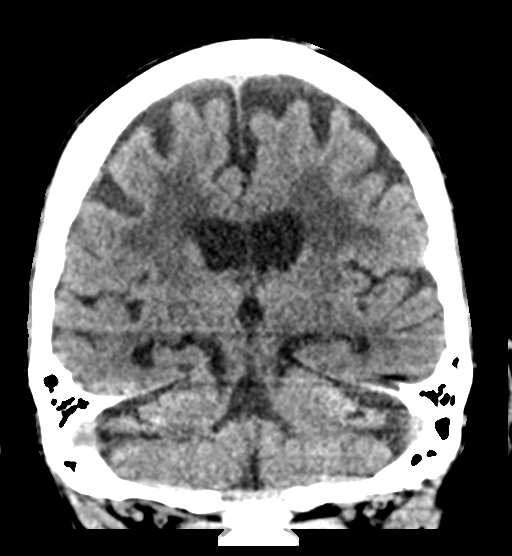

[Series 5: sagittal soft tissue · sagittal · 0.34mm/px · 3 of 53 slices shown]
[im 18/53  brain]
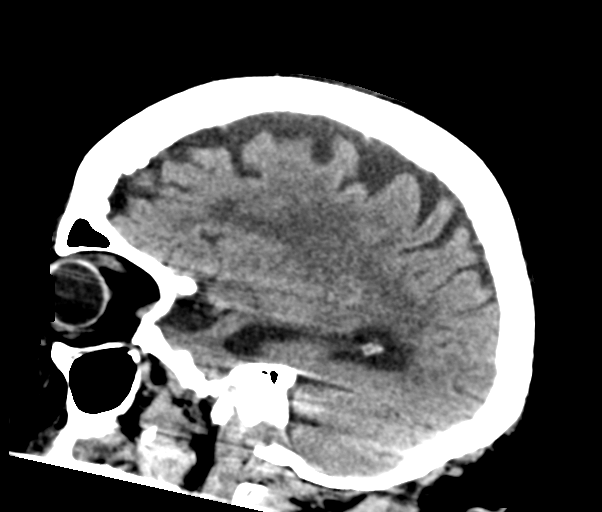
[im 27/53  brain]
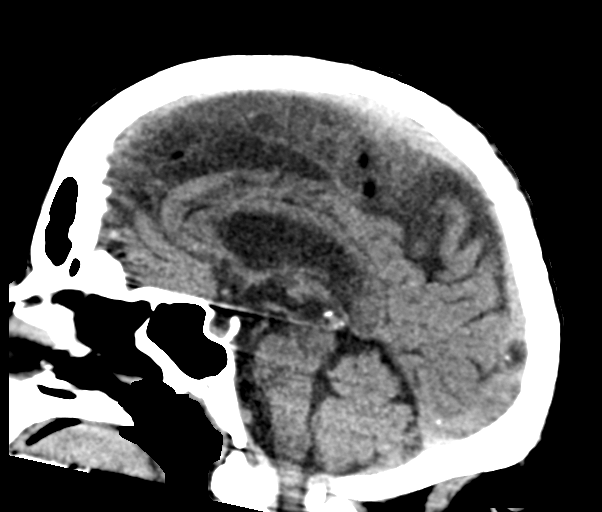
[im 35/53  brain]
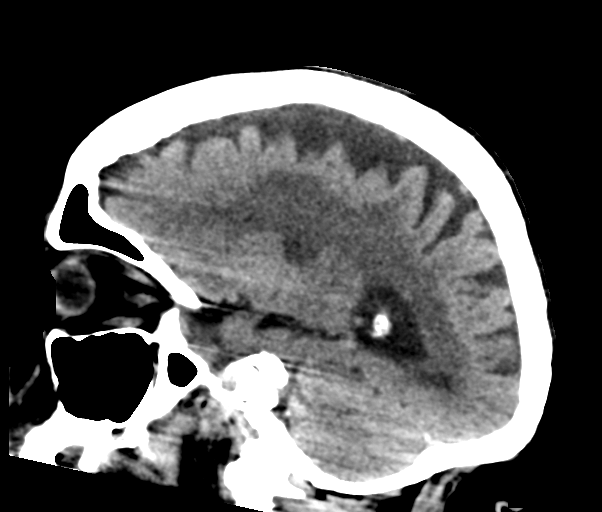

[16 of 47 positions shown; findings below may reference images not displayed]

FINDINGS: Brain: There is some progression of small vessel ischemic disease in
the periventricular white matter since the prior scan as well as
interval development of deep white matter infarct in the peripheral
aspect of the left basal ganglia extending into the region of the
corona radiata. This has the appearance of an old infarct on the
current scan. Old lacunar infarct also noted on the right near the
anterior margin of the internal capsule. The brain demonstrates no
evidence of hemorrhage, acute infarction, edema, mass effect,
extra-axial fluid collection, hydrocephalus or mass lesion.

Vascular: No hyperdense vessel or unexpected calcification.

Skull: Normal. Negative for fracture or focal lesion.

Sinuses/Orbits: No acute finding.

Other: None.
IMPRESSION: Progression of small vessel ischemic disease since 2129 with
interval development of old bilateral lacunar/ deep white matter
infarcts. These are old in appearance and do not represent acute
infarcts.

## 2018-08-20 IMAGING — CR DG CHEST 1V PORT
1 series · 1 of 1 positions shown · non-contrast
Comparison: Radiograph July 18, 2016.

CLINICAL DATA: Altered mental status.

EXAM:
PORTABLE CHEST 1 VIEW

[portable]
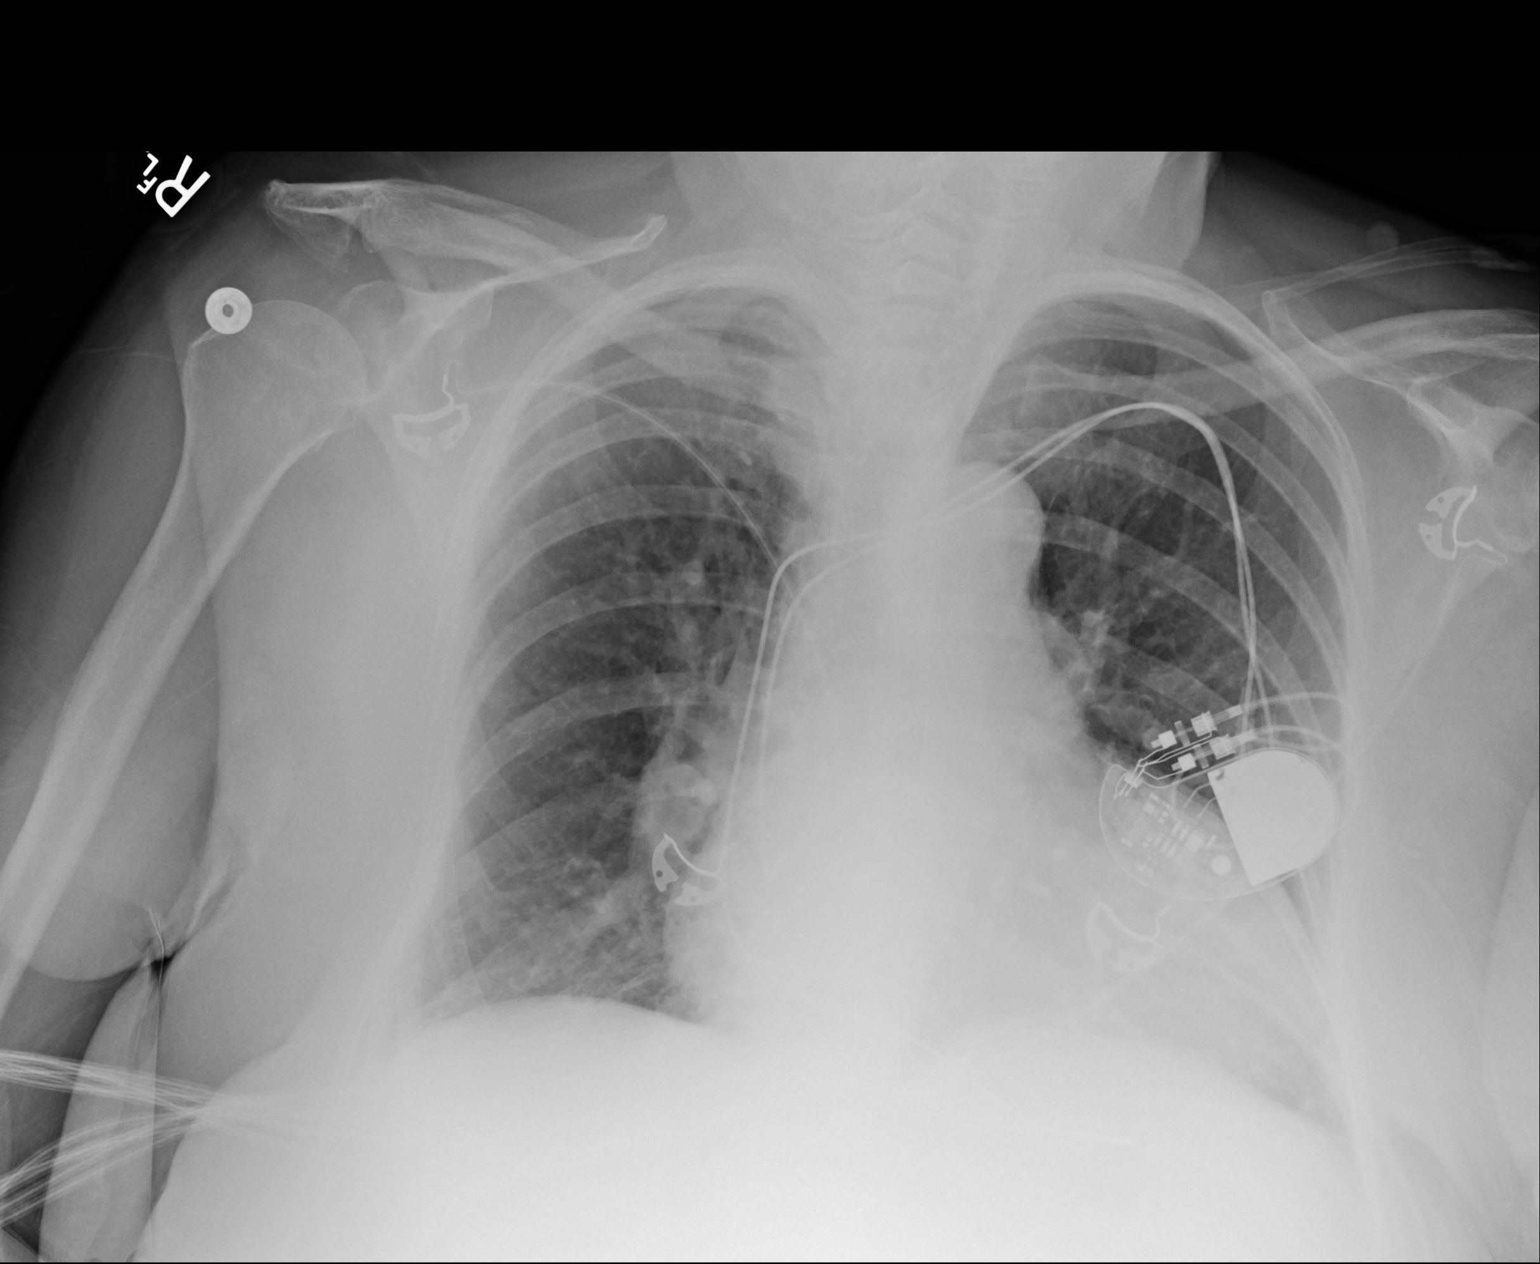

[1 of 1 positions shown; findings below may reference images not displayed]

FINDINGS: Stable cardiomegaly. Left-sided pacemaker is unchanged in position.
No pneumothorax or pleural effusion is noted. No acute pulmonary
disease is noted. Bony thorax is unremarkable.
IMPRESSION: No acute cardiopulmonary abnormality seen.

## 2018-09-01 ENCOUNTER — Ambulatory Visit (INDEPENDENT_AMBULATORY_CARE_PROVIDER_SITE_OTHER): Payer: Medicare Other | Admitting: Internal Medicine

## 2018-09-01 ENCOUNTER — Encounter: Payer: Self-pay | Admitting: Internal Medicine

## 2018-09-01 VITALS — BP 144/72 | HR 50 | Ht 62.0 in | Wt 159.0 lb

## 2018-09-01 DIAGNOSIS — I10 Essential (primary) hypertension: Secondary | ICD-10-CM | POA: Diagnosis not present

## 2018-09-01 DIAGNOSIS — I442 Atrioventricular block, complete: Secondary | ICD-10-CM | POA: Diagnosis not present

## 2018-09-01 DIAGNOSIS — I5032 Chronic diastolic (congestive) heart failure: Secondary | ICD-10-CM

## 2018-09-01 NOTE — Patient Instructions (Signed)
Medication Instructions:  Continue all current medications.  Labwork: none  Testing/Procedures: none  Follow-Up: 3 months - device clinic    Any Other Special Instructions Will Be Listed Below (If Applicable).  If you need a refill on your cardiac medications before your next appointment, please call your pharmacy.  

## 2018-09-01 NOTE — Progress Notes (Signed)
PCP: Louie Boston., MD Primary Cardiologist: Dr Diona Browner Primary EP:  Dr Johney Frame  [name] [name] is a 84 y.o. female who presents today for routine electrophysiology followup.  She has advanced dementia.  Elderly and frail.  No complaints today.  Denies CP or SOB.     Past Medical History:  Diagnosis Date  . Chronic diastolic heart failure   . Dementia   . Essential hypertension, benign   . Gout   . Mixed hyperlipidemia   . On home O2   . Osteoarthritis   . Panic attacks   . Pulmonary hypertension (HCC)    Moderate - secondary to diastolic dysfunction  . Symptomatic advanced heart block    Status post pacemaker placement  . Type 2 diabetes mellitus (HCC)    Past Surgical History:  Procedure Laterality Date  . ABDOMINAL HYSTERECTOMY    . BREAST BIOPSY    . HEMORRHOID SURGERY    . OVARIAN CYST REMOVAL    . PACEMAKER INSERTION  04/19/07   SJM Zephyr XL DR implanted by Dr Juanda Chance for syncope and mobitz II AV block    ROS- all systems are reviewed and negative except as per HPI above  Current Outpatient Medications  Medication Sig Dispense Refill  . acetaminophen (TYLENOL) 325 MG tablet Take 650 mg by mouth every morning. As needed    . amLODipine (NORVASC) 5 MG tablet Take 1 tablet (5 mg total) by mouth daily. 90 tablet 3  . carvedilol (COREG) 12.5 MG tablet Take 12.5 mg by mouth daily.     . Cranberry 450 MG TABS Take 1 tablet by mouth 2 (two) times daily.    . divalproex (DEPAKOTE) 125 MG DR tablet Take 125 mg by mouth 2 (two) times daily.    Marland Kitchen donepezil (ARICEPT) 10 MG tablet Take 10 mg by mouth at bedtime.    Marland Kitchen escitalopram (LEXAPRO) 10 MG tablet Take 10 mg by mouth daily.    . furosemide (LASIX) 40 MG tablet Take 40 mg by mouth.    Marland Kitchen HYDROcodone-acetaminophen (NORCO/VICODIN) 5-325 MG tablet Take 1 tablet by mouth every 6 (six) hours as needed for moderate pain.    Marland Kitchen lisinopril (PRINIVIL,ZESTRIL) 10 MG tablet Take 1 tablet by mouth daily at 6 (six) AM.    .  loperamide (IMODIUM) 2 MG capsule Take 2 mg by mouth every 6 (six) hours as needed. diarrhea    . memantine (NAMENDA) 10 MG tablet Take 10 mg by mouth 2 (two) times daily.    . metFORMIN (GLUCOPHAGE) 500 MG tablet Take 1,000 mg by mouth at bedtime.     . Multiple Vitamin (MULTIVITAMIN) tablet Take 1 tablet by mouth daily.    . OXcarbazepine (TRILEPTAL) 150 MG tablet Take 150 mg by mouth 2 (two) times daily.    . OXYGEN Inhale 2 L into the lungs at bedtime.     . Potassium Chloride CR (MICRO-K) 8 MEQ CPCR capsule CR Take 1 capsule by mouth daily.     . QUEtiapine (SEROQUEL) 50 MG tablet Take 50 mg by mouth at bedtime.    . saccharomyces boulardii (FLORASTOR) 250 MG capsule Take 250 mg by mouth daily at 6 (six) AM.    . trimethoprim (TRIMPEX) 100 MG tablet Take 1 tablet by mouth at bedtime.     No current facility-administered medications for this visit.     Physical Exam: Vitals:   09/01/18 1232  BP: (!) 144/72  Pulse: (!) 50  Weight: 159 lb (  72.1 kg)  Height: 5\' 2"  (1.575 m)    GEN- The patient is elderly and frail appearing, alert  Head- normocephalic, atraumatic Eyes-  Sclera clear, conjunctiva pink Ears- hearing intact Oropharynx- clear Lungs- Clear to ausculation bilaterally, normal work of breathing Chest- pacemaker pocket is well healed Heart- Regular rate and rhythm  GI- soft, NT, ND, + BS Extremities- no clubbing, cyanosis, or edema  Pacemaker interrogation- reviewed in detail today,  See PACEART report  ekg tracing ordered today is personally reviewed and shows sinus with V pacing  Assessment and Plan:  1. Symptomatic complete heart block Normal pacemaker function See Pace Art report No changes today 3 months estimated battery longevity prior to ERI.  I discussed generator change at length with her son today.  Once ERI, ok to schedule for generator change without an additional office visit required.  2. HTN Stable No change required today  3. Chronic  diastolic dysfunction Stable No change required today  Return in 3 months to see EP nurse.  Ok to schedule generator change once ERI without an additional office visit required.  Hillis Range MD, [facility] 09/01/2018 12:56 PM

## 2018-09-03 LAB — CUP PACEART INCLINIC DEVICE CHECK
Battery Impedance: 14800 Ohm
Battery Remaining Longevity: 6 mo
Battery Voltage: 2.62 V
Date Time Interrogation Session: 20190906164432
Implantable Lead Implant Date: 20080423
Implantable Lead Implant Date: 20080423
Implantable Lead Location: 753859
Implantable Lead Location: 753860
Lead Channel Impedance Value: 284 Ohm
Lead Channel Impedance Value: 310 Ohm
Lead Channel Pacing Threshold Amplitude: 0.75 V
Lead Channel Pacing Threshold Pulse Width: 0.4 ms
Lead Channel Pacing Threshold Pulse Width: 0.4 ms
Lead Channel Sensing Intrinsic Amplitude: 1.2 mV
Lead Channel Setting Pacing Amplitude: 2 V
Lead Channel Setting Pacing Pulse Width: 0.4 ms
Lead Channel Setting Sensing Sensitivity: 1.5 mV
MDC IDC MSMT LEADCHNL RV PACING THRESHOLD AMPLITUDE: 0.75 V
MDC IDC PG IMPLANT DT: 20080423
Pulse Gen Serial Number: 1898220

## 2018-12-22 ENCOUNTER — Telehealth: Payer: Self-pay | Admitting: Internal Medicine

## 2018-12-22 NOTE — Telephone Encounter (Signed)
Spoke with pts son informed him that we would not have anyone in [place] to check pts pacemaker, pts son agreeable to apt on 01/29/2019 at 3:30 with Dr Johney FrameAllred informed pts sone [facility] and would be able to go ahead plan for pacemaker changed out. Informed pts son that once pacemaker does reach ERI the device will work for 3 months, pt son voiced understanding

## 2018-12-22 NOTE — Telephone Encounter (Signed)
Asking about his mom's battery life on her device.  I [facility] and they will not be able to due to her being in a nursing home he said.

## 2019-01-12 ENCOUNTER — Encounter: Payer: Self-pay | Admitting: Internal Medicine

## 2019-01-14 ENCOUNTER — Other Ambulatory Visit
Admission: RE | Admit: 2019-01-14 | Discharge: 2019-01-14 | Disposition: A | Discharge status: Home / Self Care | Payer: Medicare Other | Source: Ambulatory Visit | Attending: *Deleted | Admitting: *Deleted

## 2019-01-14 LAB — CBC WITH DIFFERENTIAL/PLATELET
Abs Immature Granulocytes: 0.03 10*3/uL (ref 0.00–0.07)
BASOS ABS: 0 10*3/uL (ref 0.0–0.1)
Basophils Relative: 0 %
EOS PCT: 4 %
Eosinophils Absolute: 0.3 10*3/uL (ref 0.0–0.5)
HEMATOCRIT: 35.7 % — AB (ref 36.0–46.0)
HEMOGLOBIN: 11.1 g/dL — AB (ref 12.0–15.0)
Immature Granulocytes: 0 %
LYMPHS ABS: 2.2 10*3/uL (ref 0.7–4.0)
LYMPHS PCT: 27 %
MCH: 28.8 pg (ref 26.0–34.0)
MCHC: 31.1 g/dL (ref 30.0–36.0)
MCV: 92.7 fL (ref 80.0–100.0)
MONO ABS: 1 10*3/uL (ref 0.1–1.0)
Monocytes Relative: 12 %
NRBC: 0 % (ref 0.0–0.2)
Neutro Abs: 4.6 10*3/uL (ref 1.7–7.7)
Neutrophils Relative %: 57 %
PLATELETS: 184 10*3/uL (ref 150–400)
RBC: 3.85 MIL/uL — ABNORMAL LOW (ref 3.87–5.11)
RDW: 13.8 % (ref 11.5–15.5)
WBC: 8.1 10*3/uL (ref 4.0–10.5)

## 2019-01-22 NOTE — Progress Notes (Deleted)
Cardiology Office Note  Date: 01/22/2019   ID: Franchot HeidelbergMaggie W [name], DOB Aug 08, 1927, MRN [id number]  PCP: Louie Bostonapper, David B., MD  Primary Cardiologist: Nona DellSamuel , MD   No chief complaint on file.   History of Present Illness: [name] GarnetMaggie W [name] is an 85 y.o. female that I have not seen in the office since June 2017.  She follows with Dr. Johney FrameAllred in the device clinic, [employer] pacemaker in place.  Past Medical History:  Diagnosis Date  . Chronic diastolic heart failure   . Dementia (HCC)   . Essential hypertension, benign   . Gout   . Mixed hyperlipidemia   . On home O2   . Osteoarthritis   . Panic attacks   . Pulmonary hypertension (HCC)    Moderate - secondary to diastolic dysfunction  . Symptomatic advanced heart block    Status post pacemaker placement  . Type 2 diabetes mellitus (HCC)     Past Surgical History:  Procedure Laterality Date  . ABDOMINAL HYSTERECTOMY    . BREAST BIOPSY    . HEMORRHOID SURGERY    . OVARIAN CYST REMOVAL    . PACEMAKER INSERTION  04/19/07   SJM Zephyr XL DR implanted by Dr Juanda ChanceBrodie for syncope and mobitz II AV block    Current Outpatient Medications  Medication Sig Dispense Refill  . acetaminophen (TYLENOL) 325 MG tablet Take 650 mg by mouth every morning. As needed    . amLODipine (NORVASC) 5 MG tablet Take 1 tablet (5 mg total) by mouth daily. 90 tablet 3  . carvedilol (COREG) 12.5 MG tablet Take 12.5 mg by mouth daily.     . Cranberry 450 MG TABS Take 1 tablet by mouth 2 (two) times daily.    . divalproex (DEPAKOTE) 125 MG DR tablet Take 125 mg by mouth 2 (two) times daily.    Marland Kitchen. donepezil (ARICEPT) 10 MG tablet Take 10 mg by mouth at bedtime.    Marland Kitchen. escitalopram (LEXAPRO) 10 MG tablet Take 10 mg by mouth daily.    . furosemide (LASIX) 40 MG tablet Take 40 mg by mouth.    Marland Kitchen. HYDROcodone-acetaminophen (NORCO/VICODIN) 5-325 MG tablet Take 1 tablet by mouth every 6 (six) hours as needed for moderate pain.    Marland Kitchen. lisinopril (PRINIVIL,ZESTRIL) 10  MG tablet Take 1 tablet by mouth daily at 6 (six) AM.    . loperamide (IMODIUM) 2 MG capsule Take 2 mg by mouth every 6 (six) hours as needed. diarrhea    . memantine (NAMENDA) 10 MG tablet Take 10 mg by mouth 2 (two) times daily.    . metFORMIN (GLUCOPHAGE) 500 MG tablet Take 1,000 mg by mouth at bedtime.     . Multiple Vitamin (MULTIVITAMIN) tablet Take 1 tablet by mouth daily.    . OXcarbazepine (TRILEPTAL) 150 MG tablet Take 150 mg by mouth 2 (two) times daily.    . OXYGEN Inhale 2 L into the lungs at bedtime.     . Potassium Chloride CR (MICRO-K) 8 MEQ CPCR capsule CR Take 1 capsule by mouth daily.     . QUEtiapine (SEROQUEL) 50 MG tablet Take 50 mg by mouth at bedtime.    . saccharomyces boulardii (FLORASTOR) 250 MG capsule Take 250 mg by mouth daily at 6 (six) AM.    . trimethoprim (TRIMPEX) 100 MG tablet Take 1 tablet by mouth at bedtime.     No current facility-administered medications for this visit.    Allergies:  Patient has no known allergies.  Social History: The patient  reports that she has never smoked. She has never used smokeless tobacco. She reports that she does not drink alcohol or use drugs.   Family History: The patient's Family history is unknown by patient.   ROS:  Please see the history of present illness. Otherwise, complete review of systems is positive for {NONE DEFAULTED:18576::"none"}.  All other systems are reviewed and negative.   Physical Exam: VS:  There were no vitals taken for this visit., BMI There is no height or weight on file to calculate BMI.  Wt Readings from Last 3 Encounters:  09/01/18 159 lb (72.1 kg)  02/19/17 180 lb (81.6 kg)  02/02/17 180 lb (81.6 kg)    General: Patient appears comfortable at rest. HEENT: Conjunctiva and lids normal, oropharynx clear with moist mucosa. Neck: Supple, no elevated JVP or carotid bruits, no thyromegaly. Lungs: Clear to auscultation, nonlabored breathing at rest. Cardiac: Regular rate and rhythm, no S3  or significant systolic murmur, no pericardial rub. Abdomen: Soft, nontender, no hepatomegaly, bowel sounds present, no guarding or rebound. Extremities: No pitting edema, distal pulses 2+. Skin: Warm and dry. Musculoskeletal: No kyphosis. Neuropsychiatric: Alert and oriented x3, affect grossly appropriate.  ECG: I personally reviewed the tracing from 09/01/2018 which showed a ventricular paced rhythm.  Recent Labwork: 01/14/2019: Hemoglobin 11.1; Platelets 184  February 2018: Potassium 4.3, BUN 29, creatinine 0.89  Other Studies Reviewed Today:  Echocardiogram 04/24/2014: Study Conclusions  - Study data: Technically difficult study. - Left ventricle: The cavity size was normal. Wall thickness was increased in a pattern of mild LVH. Systolic function was normal. The estimated ejection fraction was in the range of 55% to 60%. Doppler parameters are consistent with abnormal left ventricular relaxation (grade 1 diastolic dysfunction). - Aortic valve: Technically difficult study, continous wave Doppler of the AV was not able to be obtained. Grossly there is no significant stenosis or regurgitation. - Mitral valve: Mildly calcified annulus. Mildly thickened leaflets . Mild regurgitation. - Left atrium: The atrium was mildly dilated.  Assessment and Plan:    Current medicines were reviewed with the patient today.  No orders of the defined types were placed in this encounter.   Disposition:  Signed, Jonelle Sidle, MD, [facility] 01/22/2019 2:42 PM    [facility] at [facility] [address] [place], [place], [place] [address] Phone: [phone]; Fax: [phone]

## 2019-01-24 ENCOUNTER — Ambulatory Visit: Payer: Medicare Other | Admitting: Cardiology

## 2019-01-29 ENCOUNTER — Encounter: Payer: Self-pay | Admitting: Internal Medicine

## 2019-01-29 ENCOUNTER — Ambulatory Visit (INDEPENDENT_AMBULATORY_CARE_PROVIDER_SITE_OTHER): Payer: Medicare Other | Admitting: Internal Medicine

## 2019-01-29 VITALS — BP 130/74 | HR 47 | Ht 62.0 in | Wt 174.6 lb

## 2019-01-29 DIAGNOSIS — Z950 Presence of cardiac pacemaker: Secondary | ICD-10-CM | POA: Diagnosis not present

## 2019-01-29 DIAGNOSIS — I442 Atrioventricular block, complete: Secondary | ICD-10-CM | POA: Diagnosis not present

## 2019-01-29 DIAGNOSIS — I5032 Chronic diastolic (congestive) heart failure: Secondary | ICD-10-CM

## 2019-01-29 DIAGNOSIS — I10 Essential (primary) hypertension: Secondary | ICD-10-CM | POA: Diagnosis not present

## 2019-01-29 NOTE — Patient Instructions (Addendum)
Medication Instructions:  Your physician recommends that you continue on your current medications as directed. Please refer to the Current Medication list given to you today.  Labwork: You will get lab work today:  BMP    Testing/Procedures: Your physician has recommended that you have your battery to your pacemaker replaced.  Follow-Up: You will follow up with device clinic 10-14 days after your procedure for a wound check.  You will follow up with Dr. Johney FrameAllred 91 days after your procedure.   PACEMAKER GENERATOR CHANGE:  Please arrive TO ADMITTING down the hall [facility] at:  1:30 pm on January 31, 2019  Use the CHG surgical scrub as directed  You may have a light breakfast before 8:00 am.  Do not eat after 8:00 am.  You may have small sips of water for dry mouth but limit your intake.  Do not take any medications the morning of the procedure  You will be discharged after your procedure  You will need someone to drive you home at discharge  If you need a refill on your cardiac medications before your next appointment,  please call your pharmacy.   Pacemaker Battery Change  A pacemaker battery usually lasts 5-15 years (6-7 years on average). A few times a year, you will be asked to visit your health care provider to have a full evaluation of your pacemaker. When the battery is low, your pacemaker battery and generator will be completely replaced. Most often, this procedure is simpler than the first surgery because the wires (leads) that connect the generator to the heart are already in place. There are many things that affect how long a pacemaker battery will last, including:  The age of the pacemaker.  The number of leads you have(1, 2, or 3).  The pacemaker workload. If the pacemaker is helping the heart more often, the battery will not last as long.  Power (voltage) settings. Tell a health care provider about:  Any  allergies you have.  All medicines you are taking, including vitamins, herbs, eye drops, creams, and over-the-counter medicines.  Any problems you or family members have had with anesthetic medicines.  Any blood disorders you have.  Any surgeries you have had, especially the surgeries you have had since your last pacemaker was placed.  Any medical conditions you have.  Whether you are pregnant or may be pregnant.  Any symptoms of heart problems, such as chest pain, trouble breathing, palpitations, light-headedness, or feelings of an abnormal or irregular heartbeat.  Smoking habits. This can affect your reaction to anesthesia. What are the risks? Generally, this is a safe procedure. However, problems may occur, including:  Bleeding.  Bruising of the skin around where the surgical cut (incision) was made.  Pulling apart of the skin at the incision site.  Infection.  Nerve damage.  Injury to other organs, such as the lungs.  Allergic reaction to anesthetics or other medicines used during the procedure.  People with diabetes may have a temporary increase in blood sugar (glucose) after any surgical procedure. General instructions  Ask your health care provider about: ? Changing or stopping your regular medicines. This is especially important if you are taking diabetes medicines or blood thinners. ? Taking medicines such as aspirin and ibuprofen. These medicines can thin your blood. Do not take these medicines before your procedure if your health care provider instructs you not to. ? Taking a sip of water with any approved medicines  on the morning of the procedure.  Plan to have someone take you home after the procedure. What happens during the procedure?  To reduce your risk of infection: ? Your health care team will wash or sanitize their hands. ? The skin around the area of the chest will be washed with soap. ? Hair may be removed from the surgical area.  An IV tube  will be inserted into one your veins to give you medicine and fluids.  You will be given one or more of the following: ? A medicine to help you relax (sedative). ? A medicine to numb the area where the pacemaker is located (local anesthetic).  You may be given antibiotic medicine to prevent infection.  Your health care provider will make an incision to reopen the pocket holding the pacemaker.  The old pacemaker will be disconnected from the leads.  The leads will be tested.  If needed, the leads will be replaced. If the leads are functioning properly, the new pacemaker will be connected to the existing leads.  A heart monitor and the pacemaker programmer will be used to make sure that the newly implanted pacemaker is working properly.  The incision site will be closed. A bandage (dressing) will be placed over the pacemaker site. The procedure may vary among health care providers and hospitals. What happens after the procedure?  Your blood pressure, heart rate, breathing rate, and blood oxygen level will be monitored until your health care team is satisfied that your pacemaker is working properly.  Your health care provider will tell you when your pacemaker will need to be tested again, or when to return to the office for removal of dressing and stitches.  Do not drive for 24 hours if you were given a sedative.  The dressing will be removed 24-48 hours after the procedure, or as told by your health care provider. Summary  A pacemaker battery usually lasts 5-15 years (6-7 years on average).  When the battery is low, your pacemaker battery and generator will be completely replaced.  Risks of this procedure include bleeding, bruising, infection, damage to other structures, pulling apart of the skin at the incision site, and allergic reactions to medicines or anesthetics.  Most often, this procedure is simpler than the first surgery because the wires (leads) that connect the generator  to the heart are already in place. This information is not intended to replace advice given to you by your health care provider. Make sure you discuss any questions you have with your health care provider. Document Released: 03/23/2007 Document Revised: 11/10/2017 Document Reviewed: 11/16/2016 Elsevier Interactive Patient Education  2019 [employer]

## 2019-01-29 NOTE — Progress Notes (Signed)
PCP: Kela Millin, MD Primary Cardiologist: Dr Diona Browner Primary EP:  Dr Johney Frame  [name] [name] is a 85 y.o. female who presents today for routine electrophysiology followup.  Since last being seen in our clinic, the patient reports doing very well.  Today, she denies symptoms of palpitations, chest pain, shortness of breath,  lower extremity edema, dizziness, presyncope, or syncope.  The patient is otherwise without complaint today.   Past Medical History:  Diagnosis Date  . Chronic diastolic heart failure   . Dementia (HCC)   . Essential hypertension, benign   . Gout   . Mixed hyperlipidemia   . On home O2   . Osteoarthritis   . Panic attacks   . Pulmonary hypertension (HCC)    Moderate - secondary to diastolic dysfunction  . Symptomatic advanced heart block    Status post pacemaker placement  . Type 2 diabetes mellitus (HCC)    Past Surgical History:  Procedure Laterality Date  . ABDOMINAL HYSTERECTOMY    . BREAST BIOPSY    . HEMORRHOID SURGERY    . OVARIAN CYST REMOVAL    . PACEMAKER INSERTION  04/19/07   SJM Zephyr XL DR implanted by Dr Juanda Chance for syncope and mobitz II AV block    ROS- all systems are reviewed and negative except as per HPI above  Current Outpatient Medications  Medication Sig Dispense Refill  . acetaminophen (TYLENOL) 325 MG tablet Take 650 mg by mouth every morning. As needed    . amLODipine (NORVASC) 5 MG tablet Take 1 tablet (5 mg total) by mouth daily. 90 tablet 3  . carvedilol (COREG) 12.5 MG tablet Take 12.5 mg by mouth daily.     . Cranberry 450 MG TABS Take 1 tablet by mouth 2 (two) times daily.    . divalproex (DEPAKOTE) 125 MG DR tablet Take 125 mg by mouth 2 (two) times daily.    Marland Kitchen donepezil (ARICEPT) 10 MG tablet Take 10 mg by mouth at bedtime.    Marland Kitchen escitalopram (LEXAPRO) 10 MG tablet Take 10 mg by mouth daily.    . furosemide (LASIX) 40 MG tablet Take 40 mg by mouth.    Marland Kitchen HYDROcodone-acetaminophen (NORCO/VICODIN) 5-325 MG  tablet Take 1 tablet by mouth every 6 (six) hours as needed for moderate pain.    Marland Kitchen lisinopril (PRINIVIL,ZESTRIL) 10 MG tablet Take 1 tablet by mouth daily at 6 (six) AM.    . loperamide (IMODIUM) 2 MG capsule Take 2 mg by mouth every 6 (six) hours as needed. diarrhea    . memantine (NAMENDA) 10 MG tablet Take 10 mg by mouth 2 (two) times daily.    . metFORMIN (GLUCOPHAGE) 500 MG tablet Take 1,000 mg by mouth at bedtime.     . Multiple Vitamin (MULTIVITAMIN) tablet Take 1 tablet by mouth daily.    . OXcarbazepine (TRILEPTAL) 150 MG tablet Take 150 mg by mouth 2 (two) times daily.    . OXYGEN Inhale 2 L into the lungs at bedtime.     . Potassium Chloride CR (MICRO-K) 8 MEQ CPCR capsule CR Take 1 capsule by mouth daily.     . QUEtiapine (SEROQUEL) 50 MG tablet Take 50 mg by mouth at bedtime.    . saccharomyces boulardii (FLORASTOR) 250 MG capsule Take 250 mg by mouth daily at 6 (six) AM.    . trimethoprim (TRIMPEX) 100 MG tablet Take 1 tablet by mouth at bedtime.     No current facility-administered medications for this visit.  Physical Exam: Vitals:   01/29/19 1554  BP: 130/74  Pulse: (!) 47  SpO2: 98%  Weight: 174 lb 9.6 oz (79.2 kg)  Height: 5\' 2"  (1.575 m)    GEN- The patient is well appearing, alert and oriented x 3 today.   Head- normocephalic, atraumatic Eyes-  Sclera clear, conjunctiva pink Ears- hearing intact Oropharynx- clear Lungs- Clear to ausculation bilaterally, normal work of breathing Chest- pacemaker pocket is well healed Heart- Regular rate and rhythm, no murmurs, rubs or gallops, PMI not laterally displaced GI- soft, NT, ND, + BS Extremities- no clubbing, cyanosis, or edema  Pacemaker interrogation- reviewed in detail today,  See PACEART report  ekg tracing ordered today is personally reviewed and shows sinus with V pacing at 47 bpm  Assessment and Plan:  1. Symptomatic complete heart block Normal pacemaker function See Arita Miss Art report No changes  today She has been ERI 12/03/18 Risks, benefits, and alternatives to pacemaker pulse generator replacement were discussed in detail today with patient and her 2 sons (medical POA).   They understand that risks include but are not limited to bleeding, infection, pneumothorax, perforation, tamponade, vascular damage, renal failure, MI, stroke, death, damage to his existing leads, and lead dislodgement and wish to proceed.  We will therefore schedule the procedure at the next available time.  I have advised that POA present with her for the procedure.  2. HTN Stable No change required today  3. Chronic diastolic dysfunction Stable No change required today   Hillis Range MD, [facility] 01/29/2019 4:02 PM

## 2019-01-30 ENCOUNTER — Telehealth: Payer: Self-pay

## 2019-01-30 LAB — CUP PACEART INCLINIC DEVICE CHECK
Battery Impedance: 26800 Ohm
Battery Remaining Longevity: 3 mo
Battery Voltage: 2.55 V
Implantable Lead Implant Date: 20080423
Implantable Lead Implant Date: 20080423
Implantable Lead Location: 753859
Implantable Lead Location: 753860
Lead Channel Impedance Value: 284 Ohm
Lead Channel Impedance Value: 286 Ohm
Lead Channel Pacing Threshold Amplitude: 0.75 V
Lead Channel Pacing Threshold Amplitude: 1 V
Lead Channel Pacing Threshold Pulse Width: 0.4 ms
Lead Channel Pacing Threshold Pulse Width: 0.4 ms
Lead Channel Setting Pacing Amplitude: 2 V
Lead Channel Setting Pacing Amplitude: 2 V
Lead Channel Setting Pacing Pulse Width: 0.4 ms
MDC IDC PG IMPLANT DT: 20080423
MDC IDC SESS DTM: 20200203205642
MDC IDC SET LEADCHNL RV SENSING SENSITIVITY: 1.5 mV
Pulse Gen Model: 5826
Pulse Gen Serial Number: 1898220

## 2019-01-30 LAB — BASIC METABOLIC PANEL
BUN/Creatinine Ratio: 23 (ref 12–28)
BUN: 21 mg/dL (ref 10–36)
CHLORIDE: 101 mmol/L (ref 96–106)
CO2: 25 mmol/L (ref 20–29)
Calcium: 8.9 mg/dL (ref 8.7–10.3)
Creatinine, Ser: 0.93 mg/dL (ref 0.57–1.00)
GFR, EST AFRICAN AMERICAN: 63 mL/min/{1.73_m2} (ref >59)
GFR, EST NON AFRICAN AMERICAN: 54 mL/min/{1.73_m2} — AB (ref >59)
Glucose: 127 mg/dL — ABNORMAL HIGH (ref 65–99)
POTASSIUM: 4.2 mmol/L (ref 3.5–5.2)
Sodium: 142 mmol/L (ref 134–144)

## 2019-01-30 NOTE — Telephone Encounter (Signed)
Call received from son.  Son asking if Pt HAS to have battery replaced.  Son/family believe Pt has a poor quality of life and state their mom had verbalized to them she did not want to live in a facility and be cared for.  Will discuss with Dr. Johney Frame.

## 2019-01-30 NOTE — Telephone Encounter (Signed)
Returned call to Pt's son [name] [name].  Advised per Dr. Johney Frame- Pt is dependent on her pacemaker.  If the family does not want her generator replaced that is the families decision because they have medical POA for Pt.  However, without a new battery Pt's pacemaker will stop and Pt is dependent on her pacemaker for her heartbeat.    Pt's son advises he and his family understand the ramifications of this decision.  He states his mother had always advised family she would not want to be taken care of in a long term care facility.  Advised son would cancel procedure. Will discuss with Dr. Johney Frame when/if should follow up with patient.  Son indicates understanding.

## 2019-01-31 ENCOUNTER — Ambulatory Visit (HOSPITAL_COMMUNITY): Admission: RE | Admit: 2019-01-31 | Payer: Medicare Other | Source: Home / Self Care | Admitting: Internal Medicine

## 2019-01-31 ENCOUNTER — Encounter (HOSPITAL_COMMUNITY): Admission: RE | Payer: Self-pay | Source: Home / Self Care

## 2019-01-31 SURGERY — PPM GENERATOR CHANGEOUT

## 2019-01-31 NOTE — Telephone Encounter (Signed)
Attempted outreach to Pt's son.  Need to confirm Pt has DNR at her facility on file.

## 2019-02-12 ENCOUNTER — Ambulatory Visit: Payer: Medicare Other

## 2019-02-27 ENCOUNTER — Ambulatory Visit: Payer: Medicare Other | Admitting: Cardiology

## 2019-03-30 ENCOUNTER — Inpatient Hospital Stay (HOSPITAL_COMMUNITY)
Admission: EM | Admit: 2019-03-30 | Discharge: 2019-04-01 | DRG: 536 | Disposition: A | Discharge status: Hospice | Payer: Medicare Other | Source: Skilled Nursing Facility | Attending: Family Medicine | Admitting: Family Medicine

## 2019-03-30 ENCOUNTER — Emergency Department (HOSPITAL_COMMUNITY): Payer: Medicare Other

## 2019-03-30 ENCOUNTER — Encounter (HOSPITAL_COMMUNITY): Payer: Self-pay | Admitting: *Deleted

## 2019-03-30 ENCOUNTER — Other Ambulatory Visit: Payer: Self-pay

## 2019-03-30 DIAGNOSIS — I340 Nonrheumatic mitral (valve) insufficiency: Secondary | ICD-10-CM | POA: Diagnosis present

## 2019-03-30 DIAGNOSIS — M109 Gout, unspecified: Secondary | ICD-10-CM | POA: Diagnosis present

## 2019-03-30 DIAGNOSIS — I272 Pulmonary hypertension, unspecified: Secondary | ICD-10-CM | POA: Diagnosis not present

## 2019-03-30 DIAGNOSIS — S72141A Displaced intertrochanteric fracture of right femur, initial encounter for closed fracture: Secondary | ICD-10-CM | POA: Diagnosis present

## 2019-03-30 DIAGNOSIS — Z90710 Acquired absence of both cervix and uterus: Secondary | ICD-10-CM

## 2019-03-30 DIAGNOSIS — Z66 Do not resuscitate: Secondary | ICD-10-CM | POA: Diagnosis present

## 2019-03-30 DIAGNOSIS — F0390 Unspecified dementia without behavioral disturbance: Secondary | ICD-10-CM | POA: Diagnosis present

## 2019-03-30 DIAGNOSIS — I110 Hypertensive heart disease with heart failure: Secondary | ICD-10-CM | POA: Diagnosis present

## 2019-03-30 DIAGNOSIS — E785 Hyperlipidemia, unspecified: Secondary | ICD-10-CM | POA: Diagnosis present

## 2019-03-30 DIAGNOSIS — I442 Atrioventricular block, complete: Secondary | ICD-10-CM | POA: Diagnosis not present

## 2019-03-30 DIAGNOSIS — Y92099 Unspecified place in other non-institutional residence as the place of occurrence of the external cause: Secondary | ICD-10-CM

## 2019-03-30 DIAGNOSIS — Z9981 Dependence on supplemental oxygen: Secondary | ICD-10-CM

## 2019-03-30 DIAGNOSIS — G309 Alzheimer's disease, unspecified: Secondary | ICD-10-CM | POA: Diagnosis not present

## 2019-03-30 DIAGNOSIS — Z950 Presence of cardiac pacemaker: Secondary | ICD-10-CM | POA: Diagnosis not present

## 2019-03-30 DIAGNOSIS — I5032 Chronic diastolic (congestive) heart failure: Secondary | ICD-10-CM | POA: Diagnosis not present

## 2019-03-30 DIAGNOSIS — E119 Type 2 diabetes mellitus without complications: Secondary | ICD-10-CM | POA: Diagnosis present

## 2019-03-30 DIAGNOSIS — Z79891 Long term (current) use of opiate analgesic: Secondary | ICD-10-CM | POA: Diagnosis not present

## 2019-03-30 DIAGNOSIS — I441 Atrioventricular block, second degree: Secondary | ICD-10-CM | POA: Diagnosis not present

## 2019-03-30 DIAGNOSIS — S72001A Fracture of unspecified part of neck of right femur, initial encounter for closed fracture: Secondary | ICD-10-CM | POA: Diagnosis not present

## 2019-03-30 DIAGNOSIS — F410 Panic disorder [episodic paroxysmal anxiety] without agoraphobia: Secondary | ICD-10-CM | POA: Diagnosis present

## 2019-03-30 DIAGNOSIS — Z7984 Long term (current) use of oral hypoglycemic drugs: Secondary | ICD-10-CM

## 2019-03-30 DIAGNOSIS — M1990 Unspecified osteoarthritis, unspecified site: Secondary | ICD-10-CM | POA: Diagnosis present

## 2019-03-30 DIAGNOSIS — E782 Mixed hyperlipidemia: Secondary | ICD-10-CM | POA: Diagnosis present

## 2019-03-30 DIAGNOSIS — W19XXXA Unspecified fall, initial encounter: Secondary | ICD-10-CM | POA: Diagnosis present

## 2019-03-30 DIAGNOSIS — Z79899 Other long term (current) drug therapy: Secondary | ICD-10-CM | POA: Diagnosis not present

## 2019-03-30 DIAGNOSIS — F0280 Dementia in other diseases classified elsewhere without behavioral disturbance: Secondary | ICD-10-CM | POA: Diagnosis not present

## 2019-03-30 LAB — BASIC METABOLIC PANEL
Anion gap: 8 (ref 5–15)
BUN: 25 mg/dL — ABNORMAL HIGH (ref 8–23)
CO2: 29 mmol/L (ref 22–32)
Calcium: 8.5 mg/dL — ABNORMAL LOW (ref 8.9–10.3)
Chloride: 102 mmol/L (ref 98–111)
Creatinine, Ser: 1.06 mg/dL — ABNORMAL HIGH (ref 0.44–1.00)
GFR calc Af Amer: 54 mL/min — ABNORMAL LOW (ref >60)
GFR calc non Af Amer: 46 mL/min — ABNORMAL LOW (ref >60)
Glucose, Bld: 185 mg/dL — ABNORMAL HIGH (ref 70–99)
Potassium: 3.7 mmol/L (ref 3.5–5.1)
Sodium: 139 mmol/L (ref 135–145)

## 2019-03-30 LAB — CBC WITH DIFFERENTIAL/PLATELET
Abs Immature Granulocytes: 0.03 10*3/uL (ref 0.00–0.07)
Basophils Absolute: 0 10*3/uL (ref 0.0–0.1)
Basophils Relative: 0 %
Eosinophils Absolute: 0.2 10*3/uL (ref 0.0–0.5)
Eosinophils Relative: 3 %
HCT: 36.1 % (ref 36.0–46.0)
Hemoglobin: 11.1 g/dL — ABNORMAL LOW (ref 12.0–15.0)
Immature Granulocytes: 0 %
Lymphocytes Relative: 24 %
Lymphs Abs: 2 10*3/uL (ref 0.7–4.0)
MCH: 29.4 pg (ref 26.0–34.0)
MCHC: 30.7 g/dL (ref 30.0–36.0)
MCV: 95.8 fL (ref 80.0–100.0)
Monocytes Absolute: 0.7 10*3/uL (ref 0.1–1.0)
Monocytes Relative: 9 %
Neutro Abs: 5.3 10*3/uL (ref 1.7–7.7)
Neutrophils Relative %: 64 %
Platelets: 188 10*3/uL (ref 150–400)
RBC: 3.77 MIL/uL — ABNORMAL LOW (ref 3.87–5.11)
RDW: 14.6 % (ref 11.5–15.5)
WBC: 8.3 10*3/uL (ref 4.0–10.5)
nRBC: 0 % (ref 0.0–0.2)

## 2019-03-30 LAB — VALPROIC ACID LEVEL: Valproic Acid Lvl: 29 ug/mL — ABNORMAL LOW (ref 50.0–100.0)

## 2019-03-30 LAB — PROTIME-INR
INR: 1 (ref 0.8–1.2)
Prothrombin Time: 13.2 seconds (ref 11.4–15.2)

## 2019-03-30 LAB — PREPARE RBC (CROSSMATCH)

## 2019-03-30 MED ORDER — CARVEDILOL 12.5 MG PO TABS
12.5000 mg | ORAL_TABLET | Freq: Every day | ORAL | Status: DC
  Administered 2019-03-31 – 2019-04-01 (×2): 12.5 mg via ORAL
  Filled 2019-03-30 (×2): qty 1

## 2019-03-30 MED ORDER — HYDROCODONE-ACETAMINOPHEN 5-325 MG PO TABS
1.0000 | ORAL_TABLET | Freq: Four times a day (QID) | ORAL | Status: DC | PRN
  Administered 2019-03-31 (×2): 1 via ORAL
  Administered 2019-03-31 – 2019-04-01 (×2): 2 via ORAL
  Filled 2019-03-30 (×2): qty 2
  Filled 2019-03-30 (×2): qty 1
  Filled 2019-03-30: qty 2

## 2019-03-30 MED ORDER — ESCITALOPRAM OXALATE 10 MG PO TABS
10.0000 mg | ORAL_TABLET | Freq: Every day | ORAL | Status: DC
  Administered 2019-03-31 – 2019-04-01 (×2): 10 mg via ORAL
  Filled 2019-03-30 (×2): qty 1

## 2019-03-30 MED ORDER — LISINOPRIL 10 MG PO TABS
10.0000 mg | ORAL_TABLET | Freq: Every day | ORAL | Status: DC
  Administered 2019-04-01: 10 mg via ORAL
  Filled 2019-03-30 (×2): qty 1

## 2019-03-30 MED ORDER — SACCHAROMYCES BOULARDII 250 MG PO CAPS
250.0000 mg | ORAL_CAPSULE | Freq: Every day | ORAL | Status: DC
  Filled 2019-03-30: qty 1

## 2019-03-30 MED ORDER — SENNOSIDES-DOCUSATE SODIUM 8.6-50 MG PO TABS: 1.0000 | ORAL_TABLET | Freq: Every evening | ORAL | Status: DC | PRN

## 2019-03-30 MED ORDER — MEMANTINE HCL 10 MG PO TABS
10.0000 mg | ORAL_TABLET | Freq: Two times a day (BID) | ORAL | Status: DC
  Administered 2019-03-31 – 2019-04-01 (×4): 10 mg via ORAL
  Filled 2019-03-30 (×4): qty 1

## 2019-03-30 MED ORDER — METHOCARBAMOL 500 MG PO TABS: 500.0000 mg | ORAL_TABLET | Freq: Four times a day (QID) | ORAL | Status: DC | PRN

## 2019-03-30 MED ORDER — TRIMETHOPRIM 100 MG PO TABS
100.0000 mg | ORAL_TABLET | Freq: Every day | ORAL | Status: DC
  Administered 2019-03-31: 100 mg via ORAL
  Filled 2019-03-30 (×3): qty 1

## 2019-03-30 MED ORDER — MORPHINE SULFATE (PF) 2 MG/ML IV SOLN
0.5000 mg | INTRAVENOUS | Status: DC | PRN
  Administered 2019-03-31 – 2019-04-01 (×3): 0.5 mg via INTRAVENOUS
  Filled 2019-03-30 (×3): qty 1

## 2019-03-30 MED ORDER — METHOCARBAMOL 1000 MG/10ML IJ SOLN: 500.0000 mg | Freq: Four times a day (QID) | INTRAVENOUS | Status: DC | PRN

## 2019-03-30 MED ORDER — HEPARIN SODIUM (PORCINE) 5000 UNIT/ML IJ SOLN
5000.0000 [IU] | Freq: Three times a day (TID) | INTRAMUSCULAR | Status: DC
  Administered 2019-03-31: 5000 [IU] via SUBCUTANEOUS
  Filled 2019-03-30 (×2): qty 1

## 2019-03-30 MED ORDER — QUETIAPINE FUMARATE 25 MG PO TABS
50.0000 mg | ORAL_TABLET | Freq: Every day | ORAL | Status: DC
  Administered 2019-03-31 (×2): 50 mg via ORAL
  Filled 2019-03-30 (×2): qty 2

## 2019-03-30 MED ORDER — DIVALPROEX SODIUM 125 MG PO CSDR
125.0000 mg | DELAYED_RELEASE_CAPSULE | Freq: Two times a day (BID) | ORAL | Status: DC
  Administered 2019-03-31 – 2019-04-01 (×4): 125 mg via ORAL
  Filled 2019-03-30 (×4): qty 1

## 2019-03-30 MED ORDER — OXCARBAZEPINE 150 MG PO TABS
150.0000 mg | ORAL_TABLET | Freq: Two times a day (BID) | ORAL | Status: DC
  Administered 2019-03-31 – 2019-04-01 (×4): 150 mg via ORAL
  Filled 2019-03-30 (×6): qty 1

## 2019-03-30 MED ORDER — DONEPEZIL HCL 5 MG PO TABS
10.0000 mg | ORAL_TABLET | Freq: Every day | ORAL | Status: DC
  Administered 2019-03-31 (×2): 10 mg via ORAL
  Filled 2019-03-30 (×2): qty 2

## 2019-03-30 MED ORDER — SODIUM CHLORIDE 0.9% IV SOLUTION: Freq: Once | INTRAVENOUS | Status: DC

## 2019-03-30 MED ORDER — AMLODIPINE BESYLATE 5 MG PO TABS
5.0000 mg | ORAL_TABLET | Freq: Every day | ORAL | Status: DC
  Administered 2019-03-31 – 2019-04-01 (×2): 5 mg via ORAL
  Filled 2019-03-30 (×2): qty 1

## 2019-03-30 NOTE — H&P (Addendum)
TRH H&P    Patient Demographics:    [name] [name], is a 85 y.o. female  MRN: [id number]  DOB - 10-09-1927  Admit Date - 03/30/2019  Referring MD/NP/PA: Erasmo Score  Outpatient Primary MD for the patient is Kela Millin, MD  Patient coming from: Skilled nursing facility  Chief complaint-fall   HPI:    [name] [name]  is a 85 y.o. female, with history of dementia, Hypertension, pulmonary hypertension, advanced third-degree heart block, status post pacemaker placement with family opting not to replace battery as per cardiology notes from February 2020, diabetes mellitus type 2, was brought to the ED after patient fell at the skilled facility.  Patient has dementia and is unable to provide any significant history.  She complains of pain in right lower extremity.  In the ED patient was found to have right intertrochanteric hip fracture. Orthopedic surgery was consulted, plan for surgery in a.m.    Review of systems:    In addition to the HPI above,   All other systems reviewed and are negative.    Past History of the following :    Past Medical History:  Diagnosis Date  . Chronic diastolic heart failure   . Dementia (HCC)   . Essential hypertension, benign   . Gout   . Mixed hyperlipidemia   . On home O2   . Osteoarthritis   . Panic attacks   . Pulmonary hypertension (HCC)    Moderate - secondary to diastolic dysfunction  . Symptomatic advanced heart block    Status post pacemaker placement  . Type 2 diabetes mellitus (HCC)       Past Surgical History:  Procedure Laterality Date  . ABDOMINAL HYSTERECTOMY    . BREAST BIOPSY    . HEMORRHOID SURGERY    . OVARIAN CYST REMOVAL    . PACEMAKER INSERTION  04/19/07   SJM Zephyr XL DR implanted by Dr Juanda Chance for syncope and mobitz II AV block      Social History:      Social History   Tobacco Use  . Smoking status: Never Smoker  .  Smokeless tobacco: Never Used  Substance Use Topics  . Alcohol use: No    Alcohol/week: 0.0 standard drinks       Family History :     Family History  Family history unknown: Yes      Home Medications:   Prior to Admission medications   Medication Sig Start Date End Date Taking? Authorizing Provider  acetaminophen (TYLENOL) 325 MG tablet Take 650 mg by mouth every morning. As needed    [provider]  amLODipine (NORVASC) 5 MG tablet Take 1 tablet (5 mg total) by mouth daily. 06/01/16   Azalee Course, PA  carvedilol (COREG) 12.5 MG tablet Take 12.5 mg by mouth daily.     [provider]  Cranberry 450 MG TABS Take 1 tablet by mouth 2 (two) times daily.    [provider]  divalproex (DEPAKOTE) 125 MG DR tablet Take 125 mg by mouth 2 (two) times  daily.    [provider]  donepezil (ARICEPT) 10 MG tablet Take 10 mg by mouth at bedtime.    [provider]  escitalopram (LEXAPRO) 10 MG tablet Take 10 mg by mouth daily. 12/28/17   [provider]  furosemide (LASIX) 40 MG tablet Take 40 mg by mouth.    [provider]  HYDROcodone-acetaminophen (NORCO/VICODIN) 5-325 MG tablet Take 1 tablet by mouth every 6 (six) hours as needed for moderate pain.    [provider]  lisinopril (PRINIVIL,ZESTRIL) 10 MG tablet Take 1 tablet by mouth daily at 6 (six) AM. 01/29/17   [provider]  loperamide (IMODIUM) 2 MG capsule Take 2 mg by mouth every 6 (six) hours as needed. diarrhea 07/08/16   [provider]  memantine (NAMENDA) 10 MG tablet Take 10 mg by mouth 2 (two) times daily.    [provider]  metFORMIN (GLUCOPHAGE) 500 MG tablet Take 1,000 mg by mouth at bedtime.     [provider]  Multiple Vitamin (MULTIVITAMIN) tablet Take 1 tablet by mouth daily.    [provider]  OXcarbazepine (TRILEPTAL) 150 MG tablet Take 150 mg by mouth 2 (two) times daily.    [provider]   OXYGEN Inhale 2 L into the lungs at bedtime.     [provider]  Potassium Chloride CR (MICRO-K) 8 MEQ CPCR capsule CR Take 1 capsule by mouth daily.  08/21/15   [provider]  QUEtiapine (SEROQUEL) 50 MG tablet Take 50 mg by mouth at bedtime.    [provider]  saccharomyces boulardii (FLORASTOR) 250 MG capsule Take 250 mg by mouth daily at 6 (six) AM.    [provider]  trimethoprim (TRIMPEX) 100 MG tablet Take 1 tablet by mouth at bedtime. 01/22/17   [provider]     Allergies:    No Known Allergies   Physical Exam:   Vitals  Blood pressure (!) 167/78, pulse (!) 50, resp. rate 14, SpO2 97 %.  1.  General: Appears in no acute distress  2. Psychiatric: Alert, not oriented x3  3. Neurologic: Moving all extremities, no focal deficit noted  4. HEENMT:  Atraumatic normocephalic  5. Respiratory : Clear to auscultation bilaterally, no wheezing or crackles.  6. Cardiovascular : S1-S2, regular, no murmur auscultated.  7. Gastrointestinal:  Abdomen is soft, nontender, no organomegaly  8. Skin:  No rashes noted  9.Musculoskeletal:  Right lower extremity is externally rotated, pain with movement of the hip.      Data Review:    CBC Recent Labs  Lab 03/30/19 2115  WBC 8.3  HGB 11.1*  HCT 36.1  PLT 188  MCV 95.8  MCH 29.4  MCHC 30.7  RDW 14.6  LYMPHSABS 2.0  MONOABS 0.7  EOSABS 0.2  BASOSABS 0.0   ------------------------------------------------------------------------------------------------------------------  Results for orders placed or performed during the hospital encounter of 03/30/19 (from the past 48 hour(s))  Type and screen [facility]     Status: None   Collection Time: 03/30/19  9:13 PM  Result Value Ref Range   ABO/RH(D) A NEG    Antibody Screen POS    Sample Expiration      04/02/2019 Performed at [facility], [address] [place], [place] [address]   Prepare RBC     Status:  None   Collection Time: 03/30/19  9:13 PM  Result Value Ref Range   Order Confirmation      ORDER PROCESSED BY  BLOOD BANK Performed at [facility], [address] [place], [place] [address]   Basic metabolic panel     Status: Abnormal   Collection Time: 03/30/19  9:15 PM  Result Value Ref Range   Sodium 139 135 - 145 mmol/L   Potassium 3.7 3.5 - 5.1 mmol/L   Chloride 102 98 - 111 mmol/L   CO2 29 22 - 32 mmol/L   Glucose, Bld 185 (H) 70 - 99 mg/dL   BUN 25 (H) 8 - 23 mg/dL   Creatinine, Ser 6.041.06 (H) 0.44 - 1.00 mg/dL   Calcium 8.5 (L) 8.9 - 10.3 mg/dL   GFR calc non Af Amer 46 (L) >60 mL/min   GFR calc Af Amer 54 (L) >60 mL/min   Anion gap 8 5 - 15    Comment: Performed at [facility], [address] [place], [place] [address]  CBC WITH DIFFERENTIAL     Status: Abnormal   Collection Time: 03/30/19  9:15 PM  Result Value Ref Range   WBC 8.3 4.0 - 10.5 K/uL   RBC 3.77 (L) 3.87 - 5.11 MIL/uL   Hemoglobin 11.1 (L) 12.0 - 15.0 g/dL   HCT 11.936.1 14.736.0 - 82.946.0 %   MCV 95.8 80.0 - 100.0 fL   MCH 29.4 26.0 - 34.0 pg   MCHC 30.7 30.0 - 36.0 g/dL   RDW 56.214.6 13.011.5 - 86.515.5 %   Platelets 188 150 - 400 K/uL   nRBC 0.0 0.0 - 0.2 %   Neutrophils Relative % 64 %   Neutro Abs 5.3 1.7 - 7.7 K/uL   Lymphocytes Relative 24 %   Lymphs Abs 2.0 0.7 - 4.0 K/uL   Monocytes Relative 9 %   Monocytes Absolute 0.7 0.1 - 1.0 K/uL   Eosinophils Relative 3 %   Eosinophils Absolute 0.2 0.0 - 0.5 K/uL   Basophils Relative 0 %   Basophils Absolute 0.0 0.0 - 0.1 K/uL   Immature Granulocytes 0 %   Abs Immature Granulocytes 0.03 0.00 - 0.07 K/uL    Comment: Performed at [facility], [address] [place], [place] [address]  Protime-INR     Status: None   Collection Time: 03/30/19  9:15 PM  Result Value Ref Range   Prothrombin Time 13.2 11.4 - 15.2 seconds   INR 1.0 0.8 - 1.2    Comment: (NOTE) INR goal varies based on device and disease states. Performed at [facility], [address] [place],  [place] [address]   Valproic acid level     Status: Abnormal   Collection Time: 03/30/19  9:15 PM  Result Value Ref Range   Valproic Acid Lvl 29 (L) 50.0 - 100.0 ug/mL    Comment: Performed at [facility], [address] [place], [place] [address]    Chemistries  Recent Labs  Lab 03/30/19 2115  NA 139  K 3.7  CL 102  CO2 29  GLUCOSE 185*  BUN 25*  CREATININE 1.06*  CALCIUM 8.5*   ------------------------------------------------------------------------------------------------------------------  ------------------------------------------------------------------------------------------------------------------ Coagulation Profile: Recent Labs  Lab 03/30/19 2115  INR 1.0    --------------------------------------------------------------------------------------------------------------- Urine analysis:    Component Value Date/Time   COLORURINE YELLOW 02/02/2017 0852   APPEARANCEUR CLEAR 02/02/2017 0852   LABSPEC 1.019 02/02/2017 0852   PHURINE 5.0 02/02/2017 0852   GLUCOSEU NEGATIVE 02/02/2017 0852   HGBUR NEGATIVE 02/02/2017 0852   BILIRUBINUR NEGATIVE 02/02/2017 0852   KETONESUR NEGATIVE 02/02/2017 0852   PROTEINUR 30 (A) 02/02/2017 0852   UROBILINOGEN 1.0 01/04/2009 1529  NITRITE NEGATIVE 02/02/2017 0852   LEUKOCYTESUR NEGATIVE 02/02/2017 7121      Imaging Results:    Dg Chest 1 View  Result Date: 03/30/2019 CLINICAL DATA:  Decreased O2 sats.  Pain after fall. EXAM: CHEST  1 VIEW COMPARISON:  February 02, 2017 FINDINGS: Stable cardiomegaly. Stable pacemaker. The hila, mediastinum, lungs, and pleura are unremarkable. IMPRESSION: No active disease. Electronically Signed   By: Gerome Sam III M.D   On: 03/30/2019 21:10   Ct Head Wo Contrast  Result Date: 03/30/2019 CLINICAL DATA:  85 year old female with head trauma. EXAM: CT HEAD WITHOUT CONTRAST CT CERVICAL SPINE WITHOUT CONTRAST TECHNIQUE: Multidetector CT imaging of the head and cervical spine was performed following  the standard protocol without intravenous contrast. Multiplanar CT image reconstructions of the cervical spine were also generated. COMPARISON:  Head CT dated 02/02/2017 FINDINGS: CT HEAD FINDINGS Brain: There is moderate age-related atrophy and chronic microvascular ischemic changes. There is no acute intracranial hemorrhage. No mass effect or midline shift. No extra-axial fluid collection. Vascular: No hyperdense vessel or unexpected calcification. Skull: Normal. Negative for fracture or focal lesion. Sinuses/Orbits: The visualized paranasal sinuses and the right mastoid air cells are clear. Mild left mastoid effusions. Other: None CT CERVICAL SPINE FINDINGS Alignment: No acute subluxation.  Grade 1 C4-C5 anterolisthesis. Skull base and vertebrae: No acute fracture.  Osteopenia. Soft tissues and spinal canal: No prevertebral fluid or swelling. No visible canal hematoma. Disc levels: Multilevel degenerative changes with disc space narrowing and endplate irregularity. Multilevel facet arthropathy. Upper chest: Negative. Other: Bilateral thyroid nodules. Further evaluation with ultrasound on a nonemergent basis recommended. Bilateral carotid bulb calcified plaques. Partially visualized left pectoral pacemaker wires. IMPRESSION: 1. No acute intracranial hemorrhage. 2. No acute/traumatic cervical spine pathology. Electronically Signed   By: Elgie Collard M.D.   On: 03/30/2019 21:20   Ct Cervical Spine Wo Contrast  Result Date: 03/30/2019 CLINICAL DATA:  85 year old female with head trauma. EXAM: CT HEAD WITHOUT CONTRAST CT CERVICAL SPINE WITHOUT CONTRAST TECHNIQUE: Multidetector CT imaging of the head and cervical spine was performed following the standard protocol without intravenous contrast. Multiplanar CT image reconstructions of the cervical spine were also generated. COMPARISON:  Head CT dated 02/02/2017 FINDINGS: CT HEAD FINDINGS Brain: There is moderate age-related atrophy and chronic microvascular  ischemic changes. There is no acute intracranial hemorrhage. No mass effect or midline shift. No extra-axial fluid collection. Vascular: No hyperdense vessel or unexpected calcification. Skull: Normal. Negative for fracture or focal lesion. Sinuses/Orbits: The visualized paranasal sinuses and the right mastoid air cells are clear. Mild left mastoid effusions. Other: None CT CERVICAL SPINE FINDINGS Alignment: No acute subluxation.  Grade 1 C4-C5 anterolisthesis. Skull base and vertebrae: No acute fracture.  Osteopenia. Soft tissues and spinal canal: No prevertebral fluid or swelling. No visible canal hematoma. Disc levels: Multilevel degenerative changes with disc space narrowing and endplate irregularity. Multilevel facet arthropathy. Upper chest: Negative. Other: Bilateral thyroid nodules. Further evaluation with ultrasound on a nonemergent basis recommended. Bilateral carotid bulb calcified plaques. Partially visualized left pectoral pacemaker wires. IMPRESSION: 1. No acute intracranial hemorrhage. 2. No acute/traumatic cervical spine pathology. Electronically Signed   By: Elgie Collard M.D.   On: 03/30/2019 21:20   Dg Hip Unilat With Pelvis 2-3 Views Right  Result Date: 03/30/2019 CLINICAL DATA:  Decreased O2 sats.  Right hip pain. EXAM: DG HIP (WITH OR WITHOUT PELVIS) 2-3V RIGHT COMPARISON:  None. FINDINGS: There is a comminuted displaced fracture through the right intertrochanteric region without dislocation. IMPRESSION:  There is a comminuted displaced fracture through the right intertrochanteric region. Electronically Signed   By: Gerome Sam III M.D   On: 03/30/2019 21:11    My personal review of EKG: Paced rhythm   Assessment & Plan:    Active Problems:   Hip fracture (HCC)   1. Hip fracture-we will admit the patient, initiate hip fracture protocol.  Vicodin, morphine PRN for pain.  Robaxin for muscle spasm.  Orthopedic surgery has seen the patient and plan for surgery in a.m.  2.  Third-degree heart block-  status post pacemaker placement.  Family has opted not to replace the battery of pacemaker.  Cardiology is aware of patient's family's decision.  See note from January 30, 2019.  3. Diabetes mellitus type 2-patient is on metformin at home.  Will start sliding scale insulin with NovoLog.  Hold metformin in the hospital.  4. Dementia-Stable, no behavior disturbance, continue Namenda, Aricept, Lexapro, Seroquel  5. Hypertension-continue Coreg, lisinopril  Risk stratification-as per cardiology note from January 30, 2019, patient is at risk of dying if pacemaker battery is not replaced.  Family has opted not to replace the battery.  Patient is high risk for surgery.  DVT Prophylaxis-   Heparin  AM Labs Ordered, also please review Full Orders  Family Communication: No family at bedside, patient has a DNR yellow form from skilled facility.  Code Status: DNR  Admission status: /Inpatient: Based on patients clinical presentation and evaluation of above clinical data, I have made determination that patient meets Inpatient criteria at this time.  Time spent in minutes : 60 minutes   Meredeth Ide M.D on 03/30/2019 at 10:48 PM

## 2019-03-30 NOTE — ED Notes (Signed)
Report given to RN on 300 

## 2019-03-30 NOTE — Progress Notes (Signed)
Telephone call to son   Expressed wishes to proceed with surgery.  (confirmed battery for pacemaker NOT replaced in February, familys wishes)  Still need cardiology to sign off on that

## 2019-03-30 NOTE — ED Notes (Signed)
Pt O2 82% on RA, pt normally wears 2L/min at SNF. Placed pt on 2L/min via [place] O2 rose to 85%. Dr. Charm Barges increased pt to 4L/min, pt increased to 96%

## 2019-03-30 NOTE — Progress Notes (Signed)
Patient ID: [name] [name], female   DOB: [birth year], 85 y.o.   MRN: [id number] BP (!) 168/79   Pulse (!) 51   Resp 19   SpO2 97%  T 98.6  CBC Latest Ref Rng & Units 03/30/2019 01/14/2019 02/02/2017  WBC 4.0 - 10.5 K/uL 8.3 8.1 6.6  Hemoglobin 12.0 - 15.0 g/dL 11.1(L) 11.1(L) 12.0  Hematocrit 36.0 - 46.0 % 36.1 35.7(L) 35.8(L)  Platelets 150 - 400 K/uL 188 184 173   BMP Latest Ref Rng & Units 01/29/2019 02/02/2017 12/27/2016  Glucose 65 - 99 mg/dL 338(V) 291(B) 99  BUN 10 - 36 mg/dL 21 16(O) 19  Creatinine 0.57 - 1.00 mg/dL 0.60 0.45 9.97  BUN/Creat Ratio 12 - 28 23 - -  Sodium 134 - 144 mmol/L 142 143 140  Potassium 3.5 - 5.2 mmol/L 4.2 4.3 3.7  Chloride 96 - 106 mmol/L 101 109 103  CO2 20 - 29 mmol/L 25 28 29   Calcium 8.7 - 10.3 mg/dL 8.9 8.9 9.2

## 2019-03-30 NOTE — ED Triage Notes (Signed)
Pt brought in by [facility] for c/o fall; pt fell and is c/o right hip pain; pt has external rotation of hip; pt was given fentanyl 50 mcg en route by [facility]

## 2019-03-30 NOTE — ED Provider Notes (Signed)
[facility]Y [facility] Provider Note   CSN: [id number] Arrival date & time: 03/30/19  1958    History   Chief Complaint Chief Complaint  Patient presents with  . Fall    HPI Neita GarnetMaggie W Stamp is a 85 y.o. female.  Level 5 caveat secondary to dementia.  Patient brought in by EMS after reported fall.  Patient was unable to provide any history.  Was given 50 mcg of fentanyl in route.  Patient is moaning in pain.     The history is provided by the patient and the EMS personnel.  Fall  This is a new problem. The current episode started 1 to 2 hours ago. The problem has not changed since onset.The symptoms are aggravated by bending and twisting. Nothing relieves the symptoms. She has tried nothing for the symptoms. The treatment provided no relief.    Past Medical History:  Diagnosis Date  . Chronic diastolic heart failure   . Dementia (HCC)   . Essential hypertension, benign   . Gout   . Mixed hyperlipidemia   . On home O2   . Osteoarthritis   . Panic attacks   . Pulmonary hypertension (HCC)    Moderate - secondary to diastolic dysfunction  . Symptomatic advanced heart block    Status post pacemaker placement  . Type 2 diabetes mellitus [facility](HCC)     Patient Active Problem List   Diagnosis Date Noted  . Dementia (HCC) 05/31/2016  . Acute on chronic diastolic heart failure (HCC) 05/31/2016  . Second degree Mobitz II AV block 03/07/2013  . Dyslipidemia   . Mitral regurgitation   . Pulmonary hypertension (HCC)   . Uncontrolled hypertension 12/08/2009  . Chronic diastolic heart failure (HCC) 12/08/2009  . PPM-St.Jude 12/08/2009    Past Surgical History:  Procedure Laterality Date  . ABDOMINAL HYSTERECTOMY    . BREAST BIOPSY    . HEMORRHOID SURGERY    . OVARIAN CYST REMOVAL    . PACEMAKER INSERTION  04/19/07   SJM Zephyr XL DR implanted by Dr Juanda ChanceBrodie for syncope and mobitz II AV block     OB History   No obstetric history on file.      Home Medications    Prior to Admission medications   Medication Sig Start Date End Date Taking? Authorizing Provider  acetaminophen (TYLENOL) 325 MG tablet Take 650 mg by mouth every morning. As needed    [provider]  amLODipine (NORVASC) 5 MG tablet Take 1 tablet (5 mg total) by mouth daily. 06/01/16   Azalee CourseMeng, Hao, PA  carvedilol (COREG) 12.5 MG tablet Take 12.5 mg by mouth daily.     [provider]  Cranberry 450 MG TABS Take 1 tablet by mouth 2 (two) times daily.    [provider]  divalproex (DEPAKOTE) 125 MG DR tablet Take 125 mg by mouth 2 (two) times daily.    [provider]  donepezil (ARICEPT) 10 MG tablet Take 10 mg by mouth at bedtime.    [provider]  escitalopram (LEXAPRO) 10 MG tablet Take 10 mg by mouth daily. 12/28/17   [provider]  furosemide (LASIX) 40 MG tablet Take 40 mg by mouth.    [provider]  HYDROcodone-acetaminophen (NORCO/VICODIN) 5-325 MG tablet Take 1 tablet by mouth every 6 (six) hours as needed for moderate pain.    [provider]  lisinopril (PRINIVIL,ZESTRIL) 10 MG tablet Take 1 tablet by mouth daily at 6 (six) AM. 01/29/17   [provider]  loperamide (IMODIUM) 2 MG capsule Take 2 mg by mouth every 6 (six) hours as needed. diarrhea 07/08/16   [provider]  memantine (NAMENDA) 10 MG tablet Take 10 mg by mouth 2 (two) times daily.    [provider]  metFORMIN (GLUCOPHAGE) 500 MG tablet Take 1,000 mg by mouth at bedtime.     [provider]  Multiple Vitamin (MULTIVITAMIN) tablet Take 1 tablet by mouth daily.    [provider]  OXcarbazepine (TRILEPTAL) 150 MG tablet Take 150 mg by mouth 2 (two) times daily.    [provider]  OXYGEN Inhale 2 L into the lungs at bedtime.     [provider]  Potassium Chloride CR (MICRO-K) 8 MEQ CPCR capsule CR Take 1 capsule by mouth daily.  08/21/15   [provider]  QUEtiapine (SEROQUEL) 50  MG tablet Take 50 mg by mouth at bedtime.    [provider]  saccharomyces boulardii (FLORASTOR) 250 MG capsule Take 250 mg by mouth daily at 6 (six) AM.    [provider]  trimethoprim (TRIMPEX) 100 MG tablet Take 1 tablet by mouth at bedtime. 01/22/17   [provider]    Family History Family History  Family history unknown: Yes    Social History Social History   Tobacco Use  . Smoking status: Never Smoker  . Smokeless tobacco: Never Used  Substance Use Topics  . Alcohol use: No    Alcohol/week: 0.0 standard drinks  . Drug use: No     Allergies   Patient has no known allergies.   Review of Systems Review of Systems  Unable to perform ROS: Dementia     Physical Exam Updated Vital Signs BP (!) 161/64 (BP Location: Right Arm)   Pulse (!) 53   Temp 98.4 F (36.9 C) (Oral)   Resp (!) 22   SpO2 99%   Physical Exam Vitals signs and nursing note reviewed.  Constitutional:      General: She is not in acute distress.    Appearance: She is well-developed.  HENT:     Head: Normocephalic and atraumatic.  Eyes:     Conjunctiva/sclera: Conjunctivae normal.  Neck:     Musculoskeletal: Neck supple.  Cardiovascular:     Rate and Rhythm: Normal rate and regular rhythm.     Heart sounds: No murmur.  Pulmonary:     Effort: Pulmonary effort is normal. No respiratory distress.     Breath sounds: Normal breath sounds.  Abdominal:     Palpations: Abdomen is soft.     Tenderness: There is no abdominal tenderness.  Musculoskeletal:        General: Tenderness, deformity and signs of injury present.     Comments: She has some shortening and external rotation of her right leg.  She is.  Pain with any movement at the hip.  Knee and ankle appear nontender.  She is full range of motion of her upper extremities and left lower extremity without any pain or limitation.  Skin:    General: Skin is warm and dry.     Capillary Refill: Capillary refill takes  less than 2 seconds.  Neurological:     General: No focal deficit present.     Mental Status: She is alert. She is disoriented.     Comments: Patient is awake but not following any commands.  She is verbal saying she needs to urinate.  No focal deficits.  ED Treatments / Results  Labs (all labs ordered are listed, but only abnormal results are displayed) Labs Reviewed  BASIC METABOLIC PANEL - Abnormal; Notable for the following components:      Result Value   Glucose, Bld 185 (*)    BUN 25 (*)    Creatinine, Ser 1.06 (*)    Calcium 8.5 (*)    GFR calc non Af Amer 46 (*)    GFR calc Af Amer 54 (*)    All other components within normal limits  CBC WITH DIFFERENTIAL/PLATELET - Abnormal; Notable for the following components:   RBC 3.77 (*)    Hemoglobin 11.1 (*)    All other components within normal limits  VALPROIC ACID LEVEL - Abnormal; Notable for the following components:   Valproic Acid Lvl 29 (*)    All other components within normal limits  PROTIME-INR  CBC  BASIC METABOLIC PANEL  TYPE AND SCREEN  PREPARE RBC (CROSSMATCH)    EKG EKG Interpretation  Date/Time:  Friday March 30 2019 20:11:26 EDT Ventricular Rate:  51 PR Interval:    QRS Duration: 175 QT Interval:  561 QTC Calculation: 517 R Axis:   -83 Text Interpretation:  Ventricular-paced rhythm No further analysis attempted due to paced rhythm similar to prior 2/18 Confirmed by Meridee Score [phone]) on 03/30/2019 8:13:01 PM   Radiology Dg Chest 1 View  Result Date: 03/30/2019 CLINICAL DATA:  Decreased O2 sats.  Pain after fall. EXAM: CHEST  1 VIEW COMPARISON:  February 02, 2017 FINDINGS: Stable cardiomegaly. Stable pacemaker. The hila, mediastinum, lungs, and pleura are unremarkable. IMPRESSION: No active disease. Electronically Signed   By: Gerome Sam III M.D   On: 03/30/2019 21:10   Ct Head Wo Contrast  Result Date: 03/30/2019 CLINICAL DATA:  85 year old female with head trauma. EXAM: CT HEAD  WITHOUT CONTRAST CT CERVICAL SPINE WITHOUT CONTRAST TECHNIQUE: Multidetector CT imaging of the head and cervical spine was performed following the standard protocol without intravenous contrast. Multiplanar CT image reconstructions of the cervical spine were also generated. COMPARISON:  Head CT dated 02/02/2017 FINDINGS: CT HEAD FINDINGS Brain: There is moderate age-related atrophy and chronic microvascular ischemic changes. There is no acute intracranial hemorrhage. No mass effect or midline shift. No extra-axial fluid collection. Vascular: No hyperdense vessel or unexpected calcification. Skull: Normal. Negative for fracture or focal lesion. Sinuses/Orbits: The visualized paranasal sinuses and the right mastoid air cells are clear. Mild left mastoid effusions. Other: None CT CERVICAL SPINE FINDINGS Alignment: No acute subluxation.  Grade 1 C4-C5 anterolisthesis. Skull base and vertebrae: No acute fracture.  Osteopenia. Soft tissues and spinal canal: No prevertebral fluid or swelling. No visible canal hematoma. Disc levels: Multilevel degenerative changes with disc space narrowing and endplate irregularity. Multilevel facet arthropathy. Upper chest: Negative. Other: Bilateral thyroid nodules. Further evaluation with ultrasound on a nonemergent basis recommended. Bilateral carotid bulb calcified plaques. Partially visualized left pectoral pacemaker wires. IMPRESSION: 1. No acute intracranial hemorrhage. 2. No acute/traumatic cervical spine pathology. Electronically Signed   By: Elgie Collard M.D.   On: 03/30/2019 21:20   Ct Cervical Spine Wo Contrast  Result Date: 03/30/2019 CLINICAL DATA:  85 year old female with head trauma. EXAM: CT HEAD WITHOUT CONTRAST CT CERVICAL SPINE WITHOUT CONTRAST TECHNIQUE: Multidetector CT imaging of the head and cervical spine was performed following the standard protocol without intravenous contrast. Multiplanar CT image reconstructions of the cervical spine were also generated.  COMPARISON:  Head CT dated 02/02/2017 FINDINGS: CT HEAD FINDINGS Brain: There is moderate  age-related atrophy and chronic microvascular ischemic changes. There is no acute intracranial hemorrhage. No mass effect or midline shift. No extra-axial fluid collection. Vascular: No hyperdense vessel or unexpected calcification. Skull: Normal. Negative for fracture or focal lesion. Sinuses/Orbits: The visualized paranasal sinuses and the right mastoid air cells are clear. Mild left mastoid effusions. Other: None CT CERVICAL SPINE FINDINGS Alignment: No acute subluxation.  Grade 1 C4-C5 anterolisthesis. Skull base and vertebrae: No acute fracture.  Osteopenia. Soft tissues and spinal canal: No prevertebral fluid or swelling. No visible canal hematoma. Disc levels: Multilevel degenerative changes with disc space narrowing and endplate irregularity. Multilevel facet arthropathy. Upper chest: Negative. Other: Bilateral thyroid nodules. Further evaluation with ultrasound on a nonemergent basis recommended. Bilateral carotid bulb calcified plaques. Partially visualized left pectoral pacemaker wires. IMPRESSION: 1. No acute intracranial hemorrhage. 2. No acute/traumatic cervical spine pathology. Electronically Signed   By: Elgie Collard M.D.   On: 03/30/2019 21:20   Dg Hip Unilat With Pelvis 2-3 Views Right  Result Date: 03/30/2019 CLINICAL DATA:  Decreased O2 sats.  Right hip pain. EXAM: DG HIP (WITH OR WITHOUT PELVIS) 2-3V RIGHT COMPARISON:  None. FINDINGS: There is a comminuted displaced fracture through the right intertrochanteric region without dislocation. IMPRESSION: There is a comminuted displaced fracture through the right intertrochanteric region. Electronically Signed   By: Gerome Sam III M.D   On: 03/30/2019 21:11    Procedures Procedures (including critical care time)  Medications Ordered in ED Medications  0.9 %  sodium chloride infusion (Manually program via Guardrails IV Fluids) ( Intravenous Not  Given 03/30/19 2229)  trimethoprim (TRIMPEX) tablet 100 mg (has no administration in time range)  amLODipine (NORVASC) tablet 5 mg (has no administration in time range)  carvedilol (COREG) tablet 12.5 mg (has no administration in time range)  lisinopril (PRINIVIL,ZESTRIL) tablet 10 mg (has no administration in time range)  donepezil (ARICEPT) tablet 10 mg (has no administration in time range)  escitalopram (LEXAPRO) tablet 10 mg (has no administration in time range)  memantine (NAMENDA) tablet 10 mg (has no administration in time range)  QUEtiapine (SEROQUEL) tablet 50 mg (has no administration in time range)  saccharomyces boulardii (FLORASTOR) capsule 250 mg (has no administration in time range)  divalproex (DEPAKOTE SPRINKLE) capsule 125 mg (has no administration in time range)  OXcarbazepine (TRILEPTAL) tablet 150 mg (has no administration in time range)  HYDROcodone-acetaminophen (NORCO/VICODIN) 5-325 MG per tablet 1-2 tablet (has no administration in time range)  morphine 2 MG/ML injection 0.5 mg (has no administration in time range)  heparin injection 5,000 Units (has no administration in time range)  methocarbamol (ROBAXIN) tablet 500 mg (has no administration in time range)    Or  methocarbamol (ROBAXIN) 500 mg in dextrose 5 % 50 mL IVPB (has no administration in time range)  senna-docusate (Senokot-S) tablet 1 tablet (has no administration in time range)     Initial Impression / Assessment and Plan / ED Course  I have reviewed the triage vital signs and the nursing notes.  Pertinent labs & imaging results that were available during my care of the patient were reviewed by me and considered in my medical decision making (see chart for details).  Clinical Course as of Mar 29 2348  Fri Mar 30, 2019  2011 Differential diagnosis includes fracture, dislocation, contusion, CNS bleed, cervical fracture   [MB]  2127 Discussed with Dr. Romeo Apple orthopedic surgery who said he would consult  on the patient in the morning.  Anticipate for OR in  the morning.   [MB]  2157 Discussed with Dr. Sharl Ma from the hospitalist service who will evaluate the patient for admission.   [MB]    Clinical Course User Index [MB] Terrilee Files, MD       Final Clinical Impressions(s) / ED Diagnoses   Final diagnoses:  Closed right hip fracture, initial encounter [facility])    ED Discharge Orders    None       Terrilee Files, MD 03/30/19 2350

## 2019-03-30 NOTE — ED Notes (Signed)
Pt cbg 197  

## 2019-03-31 ENCOUNTER — Encounter (HOSPITAL_COMMUNITY)
Admission: EM | Disposition: A | Discharge status: Hospice | Payer: Self-pay | Source: Skilled Nursing Facility | Attending: Family Medicine

## 2019-03-31 ENCOUNTER — Encounter (HOSPITAL_COMMUNITY): Payer: Self-pay | Admitting: Anesthesiology

## 2019-03-31 DIAGNOSIS — I442 Atrioventricular block, complete: Secondary | ICD-10-CM

## 2019-03-31 DIAGNOSIS — I441 Atrioventricular block, second degree: Secondary | ICD-10-CM | POA: Diagnosis present

## 2019-03-31 DIAGNOSIS — S72141A Displaced intertrochanteric fracture of right femur, initial encounter for closed fracture: Principal | ICD-10-CM

## 2019-03-31 LAB — BPAM RBC
Blood Product Expiration Date: 202004152359
Unit Type and Rh: 600

## 2019-03-31 LAB — BASIC METABOLIC PANEL
Anion gap: 9 (ref 5–15)
BUN: 20 mg/dL (ref 8–23)
CO2: 30 mmol/L (ref 22–32)
Calcium: 8.7 mg/dL — ABNORMAL LOW (ref 8.9–10.3)
Chloride: 102 mmol/L (ref 98–111)
Creatinine, Ser: 0.8 mg/dL (ref 0.44–1.00)
GFR calc Af Amer: >60 mL/min (ref >60)
GFR calc non Af Amer: >60 mL/min (ref >60)
Glucose, Bld: 150 mg/dL — ABNORMAL HIGH (ref 70–99)
Potassium: 3.8 mmol/L (ref 3.5–5.1)
Sodium: 141 mmol/L (ref 135–145)

## 2019-03-31 LAB — CBC
HCT: 34.5 % — ABNORMAL LOW (ref 36.0–46.0)
Hemoglobin: 10.7 g/dL — ABNORMAL LOW (ref 12.0–15.0)
MCH: 29.6 pg (ref 26.0–34.0)
MCHC: 31 g/dL (ref 30.0–36.0)
MCV: 95.6 fL (ref 80.0–100.0)
Platelets: 169 10*3/uL (ref 150–400)
RBC: 3.61 MIL/uL — ABNORMAL LOW (ref 3.87–5.11)
RDW: 14.2 % (ref 11.5–15.5)
WBC: 8.1 10*3/uL (ref 4.0–10.5)
nRBC: 0 % (ref 0.0–0.2)

## 2019-03-31 LAB — ALBUMIN: Albumin: 3.5 g/dL (ref 3.5–5.0)

## 2019-03-31 LAB — SURGICAL PCR SCREEN
MRSA, PCR: NEGATIVE
Staphylococcus aureus: NEGATIVE

## 2019-03-31 SURGERY — FIXATION, FRACTURE, INTERTROCHANTERIC, WITH INTRAMEDULLARY ROD
Anesthesia: Choice | Laterality: Right

## 2019-03-31 MED ORDER — CHLORHEXIDINE GLUCONATE 4 % EX LIQD
60.0000 mL | Freq: Once | CUTANEOUS | Status: DC
  Filled 2019-03-31: qty 30

## 2019-03-31 MED ORDER — POVIDONE-IODINE 10 % EX SWAB: 2.0000 "application " | Freq: Once | CUTANEOUS | Status: DC

## 2019-03-31 NOTE — Consult Note (Signed)
HOSPITAL CONSULT (HIP FRACTURE )  Patient ID: [name] [name], female   DOB: 1927-11-01, 85 y.o.   MRN: [id number]  New patient  Requested by: Dr Maryln Manuel  Reason for: right hip fracture   Chief Complaint  Patient presents with  . Fall     [name] [name] is a 85 y.o. female.    HPI   The following history was taken from the medical record and what I could obtain from the patients son. (dementia)  [name] [name]  is a 85 y.o. female, with history of dementia, Hypertension, pulmonary hypertension, advanced third-degree heart block, status post pacemaker placement with family opting not to replace battery as per cardiology notes from February 2020, diabetes mellitus type 2, was brought to the ED after patient fell at the skilled facility.  Patient has dementia and is unable to provide any significant history.  She complains of pain in right lower extremity.  In the ED patient was found to have right intertrochanteric hip fracture. Orthopedic surgery was consulted, plan for surgery in a.m.  Location righ hip Duration 1 day  Severity cnd Quality cnd  Modified by cnd  Review of Systems (all) Review of Systems  Unable to perform ROS: Dementia    Past Medical History:  Diagnosis Date  . Chronic diastolic heart failure   . Dementia (HCC)   . Essential hypertension, benign   . Gout   . Mixed hyperlipidemia   . On home O2   . Osteoarthritis   . Panic attacks   . Pulmonary hypertension (HCC)    Moderate - secondary to diastolic dysfunction  . Symptomatic advanced heart block    Status post pacemaker placement  . Type 2 diabetes mellitus (HCC)      Past Surgical History:  Procedure Laterality Date  . ABDOMINAL HYSTERECTOMY    . BREAST BIOPSY    . HEMORRHOID SURGERY    . OVARIAN CYST REMOVAL    . PACEMAKER INSERTION  04/19/07   SJM Zephyr XL DR implanted by Dr Juanda Chance for syncope and mobitz II AV block   Family History  Family history unknown: Yes    Social  History Social History   Tobacco Use  . Smoking status: Never Smoker  . Smokeless tobacco: Never Used  Substance Use Topics  . Alcohol use: No    Alcohol/week: 0.0 standard drinks  . Drug use: No    No Known Allergies  Current Facility-Administered Medications  Medication Dose Route Frequency Provider Last Rate Last Dose  . 0.9 %  sodium chloride infusion (Manually program via Guardrails IV Fluids)   Intravenous Once [place], Sarina Ill, MD      . amLODipine (NORVASC) tablet 5 mg  5 mg Oral Daily [place], Sarina Ill, MD      . carvedilol (COREG) tablet 12.5 mg  12.5 mg Oral Daily [place], Sarina Ill, MD      . chlorhexidine (HIBICLENS) 4 % liquid 4 application  60 mL Topical Once Vickki Hearing, MD      . divalproex (DEPAKOTE SPRINKLE) capsule 125 mg  125 mg Oral BID Meredeth Ide, MD   125 mg at 03/31/19 0007  . donepezil (ARICEPT) tablet 10 mg  10 mg Oral QHS Meredeth Ide, MD   10 mg at 03/31/19 0007  . escitalopram (LEXAPRO) tablet 10 mg  10 mg Oral Daily Sharl Ma, Sarina Ill, MD      . HYDROcodone-acetaminophen (NORCO/VICODIN) 5-325 MG per tablet 1-2 tablet  1-2 tablet Oral  Q6H PRN Meredeth Ide, MD   1 tablet at 03/31/19 0007  . lisinopril (PRINIVIL,ZESTRIL) tablet 10 mg  10 mg Oral Q0600 Meredeth Ide, MD      . memantine [facility]) tablet 10 mg  10 mg Oral BID Meredeth Ide, MD   10 mg at 03/31/19 0007  . methocarbamol (ROBAXIN) tablet 500 mg  500 mg Oral Q6H PRN Meredeth Ide, MD       Or  . methocarbamol (ROBAXIN) 500 mg in dextrose 5 % 50 mL IVPB  500 mg Intravenous Q6H PRN Sharl Ma, Sarina Ill, MD      . morphine 2 MG/ML injection 0.5 mg  0.5 mg Intravenous Q2H PRN Meredeth Ide, MD      . OXcarbazepine (TRILEPTAL) tablet 150 mg  150 mg Oral BID Meredeth Ide, MD   150 mg at 03/31/19 0114  . povidone-iodine 10 % swab 2 application  2 application Topical Once Vickki Hearing, MD      . QUEtiapine (SEROQUEL) tablet 50 mg  50 mg Oral QHS Meredeth Ide, MD   50 mg at 03/31/19 0007  . saccharomyces  boulardii (FLORASTOR) capsule 250 mg  250 mg Oral Q0600 Sharl Ma, Sarina Ill, MD      . senna-docusate (Senokot-S) tablet 1 tablet  1 tablet Oral QHS PRN Meredeth Ide, MD      . trimethoprim (TRIMPEX) tablet 100 mg  100 mg Oral QHS Meredeth Ide, MD   100 mg at 03/31/19 0114     Physical Exam(=30) BP (!) 182/63 (BP Location: Right Arm)   Pulse (!) 56   Temp 98.4 F (36.9 C) (Oral)   Resp (!) 22   Wt 79.5 kg   SpO2 96%   BMI 32.06 kg/m   Gen. Appearance NORMAL  Peripheral vascular system NORMAL COLOR AND TEMPERATURE  Lymph nodes NORMAL GROIN  Gait CANT WALK   Left Upper extremity  Inspection revealed no malalignment or asymmetry  Assessment of range of motion: Full range of motion was recorded  Assessment of stability: Elbow wrist and hand and shoulder were stable  Assessment of muscle strength and tone revealed grade 5 muscle strength and normal muscle tone  Skin was normal without rash lesion or ulceration  Right upper extremity  Inspection revealed no malalignment or asymmetry  Assessment of range of motion: Full range of motion was recorded  Assessment of stability: Elbow wrist and hand and shoulder were stable  Assessment of muscle strength and tone revealed grade 5 muscle strength and normal muscle tone  Skin was normal without rash lesion or ulceration  Right Lower extremity  Inspection EXTERNAL ROTATION DEFORMITY WITH FLEXION   Assessment of range of motion: DEFERRED, PAIN   Assessment of stability: Ankle, knee  Stable, HIP DEFERRED   Assessment of muscleand tone r normal muscle tone, NO TREMOR   Skin was normal without rash lesion or ulceration  Left lower extremity  Inspection revealed no malalignment or asymmetry, KNEE INCISION FROM TKA   Assessment of range of motion: KNEE FLEXION ~ 100   Assessment of stability: Ankle, knee and hip were stable  Assessment of muscle strength and tone revealed grade 5 muscle strength and normal muscle tone  Skin was normal without  rash lesion or ulceration   Coordination was tested by finger-to-nose nose and was ABNORMAL BUT SHE CANT HEAR AND SHE IS CONFUSED  Deep tendon reflexes were 1-2+ in the upper extremities and  Examination of sensation  by touch was normal  Mental status  DISOriented to time person and place   Mood and affect normal   Dx:   Data Reviewed  I reviewed the images and the reports and my independent interpretation is DISPLACED STABLE RIGHT INTERTROCH FRACTURE    Assessment  RIGHT HIP FRACTURE   Plan  CARDIOLOGY CONSULT TO DISCUSS PACEMAKER BATTERY, IF OK THEN,  ORIF RIGHT HIP WITH GAMMA NAIL   VIA SON, The procedure has been fully reviewed with the patient; The risks and benefits of surgery have been discussed and explained and understood. Alternative treatment has also been reviewed, questions were encouraged and answered. The postoperative plan is also been reviewed.    Vickki Hearing MD

## 2019-03-31 NOTE — Progress Notes (Addendum)
PROGRESS NOTE  [name] [name]  YIR:[id number]  DOB: April 04, 1927  DOA: 03/30/2019 PCP: Kela Millin, MD  Brief Admission Hx: 85 year old female with complete heart block, severe dementia, hypertension, pulmonary hypertension, status post pacemaker placement, diabetes mellitus type 2 admitted from SNF after fall with right hip fracture.  MDM/Assessment & Plan:   1. Right hip fracture- at this time patient may not have surgery due to not having a functional pacemaker where battery is near running out.   I spoke with Dr. Romeo Apple.  He spoke with sons and they decided to have patient sent to [facility] for battery replacement and subsequently having ORIF.   I had a long discussion with them and gave them a hospice option as they had already decided in Feb not to replace the battery due to poor QOL and her QOL likely will not improve if she has the hip surgery with her advanced dementia.  They will consider and let me know prior to sending her to [place] for new battery and surgery.  Update: son called me back and says he spoke with everyone and they are all in agreement that they want to pursue full comfort care/hospice and requested residential hospice care if possible.  They have had a good experience with [facility] and that is their first choice.  I updated Dr. Romeo Apple who verbalized understanding.  2. Third-degree heart block-status post permanent pacemaker placement.  The patient was due for battery replacement in February 2020 however family opted not to repeat place the battery and patient had been made DNR.  Dr. Romeo Apple discussed with Dr. Rennis Golden of cardiology service. 3. Type 2 diabetes mellitus- patient is covered with sliding scale insulin, holding home metformin. 4. Advanced dementia- continue Namenda, Aricept,'s Seroquel and Lexapro. 5. Essential hypertension- maintained on carvedilol and lisinopril.  DVT prophylaxis: Heparin Code Status: DNR Family Communication: son (poa)  Disposition Plan: Inpatient management  Consultants:  Orthopedics  Procedures:  Pending  Antimicrobials:  N/A  Subjective: Patient has advanced dementia  Objective: Vitals:   03/30/19 2130 03/30/19 2300 03/30/19 2336 03/31/19 0615  BP: (!) 167/78 (!) 153/64 (!) 161/64 (!) 182/63  Pulse: (!) 50 (!) 50 (!) 53 (!) 56  Resp: 14 20 (!) 22 (!) 22  Temp:   98.4 F (36.9 C) 98.4 F (36.9 C)  TempSrc:   Oral Oral  SpO2: 97% 97% 99% 96%  Weight:   79.5 kg     Intake/Output Summary (Last 24 hours) at 03/31/2019 0934 Last data filed at 03/31/2019 0800 Gross per 24 hour  Intake 0 ml  Output 1 ml  Net -1 ml   Filed Weights   03/30/19 2336  Weight: 79.5 kg     REVIEW OF SYSTEMS  Unable to obtain due to advanced dementia  Exam:  General exam: Elderly female lying in the bed in no apparent distress with advanced dementia.  Mostly nonverbal. Respiratory system:  No increased work of breathing. Cardiovascular system: Normal S1 & S2 heard with pacemaker sounds.  Gastrointestinal system: Abdomen is nondistended, soft and nontender. Normal bowel sounds heard. Central nervous system: Advanced dementia, alert. No focal neurological deficits. Extremities: Shortened and externally rotated right lower extremity good pedal pulses bilateral lower extremities.  Data Reviewed: Basic Metabolic Panel: Recent Labs  Lab 03/30/19 2115 03/31/19 0609  NA 139 141  K 3.7 3.8  CL 102 102  CO2 29 30  GLUCOSE 185* 150*  BUN 25* 20  CREATININE 1.06* 0.80  CALCIUM 8.5* 8.7*  Liver Function Tests: No results for input(s): AST, ALT, ALKPHOS, BILITOT, PROT, ALBUMIN in the last 168 hours. No results for input(s): LIPASE, AMYLASE in the last 168 hours. No results for input(s): AMMONIA in the last 168 hours. CBC: Recent Labs  Lab 03/30/19 2115 03/31/19 0609  WBC 8.3 8.1  NEUTROABS 5.3  --   HGB 11.1* 10.7*  HCT 36.1 34.5*  MCV 95.8 95.6  PLT 188 169   Cardiac Enzymes: No results  for input(s): CKTOTAL, CKMB, CKMBINDEX, TROPONINI in the last 168 hours. CBG (last 3)  No results for input(s): GLUCAP in the last 72 hours. Recent Results (from the past 240 hour(s))  Surgical PCR screen     Status: None   Collection Time: 03/31/19  1:08 AM  Result Value Ref Range Status   MRSA, PCR NEGATIVE NEGATIVE Final   Staphylococcus aureus NEGATIVE NEGATIVE Final    Comment: (NOTE) The Xpert SA Assay (FDA approved for NASAL specimens in patients 43 years of age and older), is one component of a comprehensive surveillance program. It is not intended to diagnose infection nor to guide or monitor treatment. Performed at [facility], [address] [place], [place] [address]      Studies: Dg Chest 1 View  Result Date: 03/30/2019 CLINICAL DATA:  Decreased O2 sats.  Pain after fall. EXAM: CHEST  1 VIEW COMPARISON:  February 02, 2017 FINDINGS: Stable cardiomegaly. Stable pacemaker. The hila, mediastinum, lungs, and pleura are unremarkable. IMPRESSION: No active disease. Electronically Signed   By: Gerome Sam III M.D   On: 03/30/2019 21:10   Ct Head Wo Contrast  Result Date: 03/30/2019 CLINICAL DATA:  85 year old female with head trauma. EXAM: CT HEAD WITHOUT CONTRAST CT CERVICAL SPINE WITHOUT CONTRAST TECHNIQUE: Multidetector CT imaging of the head and cervical spine was performed following the standard protocol without intravenous contrast. Multiplanar CT image reconstructions of the cervical spine were also generated. COMPARISON:  Head CT dated 02/02/2017 FINDINGS: CT HEAD FINDINGS Brain: There is moderate age-related atrophy and chronic microvascular ischemic changes. There is no acute intracranial hemorrhage. No mass effect or midline shift. No extra-axial fluid collection. Vascular: No hyperdense vessel or unexpected calcification. Skull: Normal. Negative for fracture or focal lesion. Sinuses/Orbits: The visualized paranasal sinuses and the right mastoid air cells are clear. Mild  left mastoid effusions. Other: None CT CERVICAL SPINE FINDINGS Alignment: No acute subluxation.  Grade 1 C4-C5 anterolisthesis. Skull base and vertebrae: No acute fracture.  Osteopenia. Soft tissues and spinal canal: No prevertebral fluid or swelling. No visible canal hematoma. Disc levels: Multilevel degenerative changes with disc space narrowing and endplate irregularity. Multilevel facet arthropathy. Upper chest: Negative. Other: Bilateral thyroid nodules. Further evaluation with ultrasound on a nonemergent basis recommended. Bilateral carotid bulb calcified plaques. Partially visualized left pectoral pacemaker wires. IMPRESSION: 1. No acute intracranial hemorrhage. 2. No acute/traumatic cervical spine pathology. Electronically Signed   By: Elgie Collard M.D.   On: 03/30/2019 21:20   Ct Cervical Spine Wo Contrast  Result Date: 03/30/2019 CLINICAL DATA:  85 year old female with head trauma. EXAM: CT HEAD WITHOUT CONTRAST CT CERVICAL SPINE WITHOUT CONTRAST TECHNIQUE: Multidetector CT imaging of the head and cervical spine was performed following the standard protocol without intravenous contrast. Multiplanar CT image reconstructions of the cervical spine were also generated. COMPARISON:  Head CT dated 02/02/2017 FINDINGS: CT HEAD FINDINGS Brain: There is moderate age-related atrophy and chronic microvascular ischemic changes. There is no acute intracranial hemorrhage. No mass effect or midline shift. No extra-axial fluid  collection. Vascular: No hyperdense vessel or unexpected calcification. Skull: Normal. Negative for fracture or focal lesion. Sinuses/Orbits: The visualized paranasal sinuses and the right mastoid air cells are clear. Mild left mastoid effusions. Other: None CT CERVICAL SPINE FINDINGS Alignment: No acute subluxation.  Grade 1 C4-C5 anterolisthesis. Skull base and vertebrae: No acute fracture.  Osteopenia. Soft tissues and spinal canal: No prevertebral fluid or swelling. No visible canal  hematoma. Disc levels: Multilevel degenerative changes with disc space narrowing and endplate irregularity. Multilevel facet arthropathy. Upper chest: Negative. Other: Bilateral thyroid nodules. Further evaluation with ultrasound on a nonemergent basis recommended. Bilateral carotid bulb calcified plaques. Partially visualized left pectoral pacemaker wires. IMPRESSION: 1. No acute intracranial hemorrhage. 2. No acute/traumatic cervical spine pathology. Electronically Signed   By: Elgie Collard M.D.   On: 03/30/2019 21:20   Dg Hip Unilat With Pelvis 2-3 Views Right  Result Date: 03/30/2019 CLINICAL DATA:  Decreased O2 sats.  Right hip pain. EXAM: DG HIP (WITH OR WITHOUT PELVIS) 2-3V RIGHT COMPARISON:  None. FINDINGS: There is a comminuted displaced fracture through the right intertrochanteric region without dislocation. IMPRESSION: There is a comminuted displaced fracture through the right intertrochanteric region. Electronically Signed   By: Gerome Sam III M.D   On: 03/30/2019 21:11     Scheduled Meds: . sodium chloride   Intravenous Once  . amLODipine  5 mg Oral Daily  . carvedilol  12.5 mg Oral Daily  . chlorhexidine  60 mL Topical Once  . divalproex  125 mg Oral BID  . donepezil  10 mg Oral QHS  . escitalopram  10 mg Oral Daily  . lisinopril  10 mg Oral Q0600  . memantine  10 mg Oral BID  . OXcarbazepine  150 mg Oral BID  . povidone-iodine  2 application Topical Once  . QUEtiapine  50 mg Oral QHS  . saccharomyces boulardii  250 mg Oral Q0600  . trimethoprim  100 mg Oral QHS   Continuous Infusions: . methocarbamol (ROBAXIN) IV      Active Problems:   Hip fracture (HCC)  Time spent:   Standley Dakins, MD [facility] 03/31/2019, 9:34 AM    LOS: 1 day  How to contact the [facility] Attending or Consulting provider 7A - 7P or covering provider during after hours 7P -7A, for this patient?  1. Check the care team in [facility] and look for a) attending/consulting TRH provider listed  and b) the [facility] team listed 2. Log into [email or web address] and use [place]'s universal password to access. If you do not have the password, please contact the hospital operator. 3. Locate the [facility] provider you are looking for under [facility] and page to a number that you can be directly reached. 4. If you still have difficulty reaching the provider, please page the [facility] (Director on Call) for the Hospitalists listed on amion for assistance.

## 2019-03-31 NOTE — Progress Notes (Signed)
Patient ID: [name] [name], female   DOB: 11/27/1927, 85 y.o.   MRN: [id number] CARDIOLOGY DISCUSSION: W/ DR HILTY  BATTERY MAY OR MAY NOT LAST THRU SURGERY; IF IT FAILS DURING SURGERY THEN WE WOULD HAVE TO EXTERNALLY PACE HER.  PACEMAKER DEPENDENT, FAMILY DOESN'T WANT TO DO REPLACE BATTERY; AND DNR. BATTERY MAY RUN OUT  3 MOS LEFT AS OF 11/2019   LAST NOTE FEB 2020 FROM CARDIOLOGY:    Pacemaker interrogation- reviewed in detail today,  See PACEART report   ekg tracing ordered today is personally reviewed and shows sinus with V pacing at 47 bpm   Assessment and Plan:   1. Symptomatic complete heart block Normal pacemaker function See Arita Miss Art report No changes today She has been ERI 12/03/18- Risks, benefits, and alternatives to pacemaker pulse generator replacement were discussed in detail today with patient and her 2 sons (medical POA).   They understand that risks include but are not limited to bleeding, infection, pneumothorax, perforation, tamponade, vascular damage, renal failure, MI, stroke, death, damage to his existing leads, and lead dislodgement and wish to proceed.  We will therefore schedule the procedure at the next available time.  I have advised that POA present with her for the procedure.   2. HTN Stable No change required today   3. Chronic diastolic dysfunction Stable No change required today     Hillis Range MD, [facility] 01/29/2019 4:02 PM   RECOMMEND NO SURGERY BE DONE, WITHOUT A FUNCTIONAL PACEMAKER  HE WANTS TO TALK TO HIS SIBLINGS  (HE IS THE POA)

## 2019-03-31 NOTE — TOC Initial Note (Signed)
Transition of Care [facility]) - Initial/Assessment Note    Patient Details  Name: [name] [name] MRN: [id number] Date of Birth: July 19, 1927  [facility] CM/SW Contact:    Judi Cong, LCSW Phone Number: 03/31/2019, 12:31 PM  Clinical Narrative:  The CSW received a consult for hospice home placement with the patient's HCPOA preference as [facility]. The CSW contacted the admissions team and provided the referral information as requested. The TOC is following and will update once more information is available.   The patient currently has a pacemaker; however, the pacemaker battery has not been replaced, and the patient's pacemaker is set to fail due to lack of charge. The patient is not deemed an appropriate candidate for surgical intervention for her hip fracture due to the pacemaker barrier, and her family has reported to the medical team that the patient would not want to continue being cared for at a SNF due to lowered QOL.               Expected Discharge Plan: Hospice Medical Facility Barriers to Discharge: Continued Medical Work up, Other (comment)(Pending Hospice Referral)   Patient Goals and CMS Choice Patient states their goals for this hospitalization and ongoing recovery are:: N/A CMS Medicare.gov Compare Post Acute Care list provided to:: Other (Comment Required)(N/A) Choice offered to / list presented to : NA  Expected Discharge Plan and Services Expected Discharge Plan: Hospice Medical Facility In-house Referral: NA Discharge Planning Services: NA Post Acute Care Choice: NA Living arrangements for the past 2 months: Skilled Nursing Facility                 DME Arranged: N/A DME Agency: NA HH Arranged: NA    Prior Living Arrangements/Services Living arrangements for the past 2 months: Skilled Nursing Facility Lives with:: Facility Resident Patient language and need for interpreter reviewed:: Yes        Need for Family Participation  in Patient Care: Yes (Comment)(Patient has dementia and not oriented past self at this time.) Care giver support system in place?: Yes (comment)   Criminal Activity/Legal Involvement Pertinent to Current Situation/Hospitalization: No - Comment as needed  Activities of Daily Living   ADL Screening (condition at time of admission) Patient's cognitive ability adequate to safely complete daily activities?: No Is the patient deaf or have difficulty hearing?: Yes Does the patient have difficulty seeing, even when wearing glasses/contacts?: No Does the patient have difficulty concentrating, remembering, or making decisions?: Yes Patient able to express need for assistance with ADLs?: No Does the patient have difficulty dressing or bathing?: Yes Independently performs ADLs?: No Communication: Independent(dementia ) Dressing (OT): Needs assistance Is this a change from baseline?: Pre-admission baseline Grooming: Needs assistance Is this a change from baseline?: Pre-admission baseline Feeding: Appropriate for developmental age Bathing: Needs assistance Is this a change from baseline?: Pre-admission baseline Toileting: Dependent Is this a change from baseline?: Change from baseline, expected to last <3 days In/Out Bed: Needs assistance Is this a change from baseline?: Pre-admission baseline Walks in Home: Needs assistance Is this a change from baseline?: Pre-admission baseline Does the patient have difficulty walking or climbing stairs?: Yes Weakness of Legs: Both Weakness of Arms/Hands: None  Permission Sought/Granted Permission sought to share information with : Facility Medical sales representative, Family Supports                Emotional Assessment Appearance:: Appears stated age Attitude/Demeanor/Rapport: Lethargic Affect (typically observed): Stable Orientation: : Oriented to Self  Alcohol / Substance Use: Never Used Psych Involvement: No (comment)  Admission diagnosis:  Closed  right hip fracture, initial encounter [facility]) [S72.001A] Patient Active Problem List   Diagnosis Date Noted  . Symptomatic advanced heart block 03/31/2019  . Closed right hip fracture, initial encounter (HCC) 03/30/2019  . Dementia (HCC) 05/31/2016  . Acute on chronic diastolic heart failure (HCC) 05/31/2016  . Second degree Mobitz II AV block 03/07/2013  . Dyslipidemia   . Mitral regurgitation   . Pulmonary hypertension (HCC)   . Uncontrolled hypertension 12/08/2009  . Chronic diastolic heart failure (HCC) 12/08/2009  . PPM-St.Jude 12/08/2009   PCP:  Kela Millin, MD Pharmacy:   [facility] [address] [place] - [address] [place] [address] Phone: [phone] Fax: [phone]     Social Determinants of Health (SDOH) Interventions    Readmission Risk Interventions No flowsheet data found.

## 2019-03-31 NOTE — Progress Notes (Signed)
Called by Dr Romeo Apple regarding this patient. She is followed by Dr. Johney Frame - she is 85 yo and according to family has a poor QOL. Recently her pacemaker was noted to be at ERI (11/2018) - she has underlying complete heart block and is pacer-dependent. After discussion with family, who is the POA - Dr. Johney Frame noted that the decision was made not to replace the pacemaker generator. The consequence of this decision is death when the pacer battery runs out. According to the last remote check on 01/29/2019, battery voltage was 2.55V and estimated longevity was 3 months. Unfortunately, she is admitted at [facility] with hip fracture. I had a long discussion with Dr. Romeo Apple about her prognosis - he had intended to operate on her, given poor outcome without operation and need for pain control. However, since there is no plan to replace the pacer when the battery runs out, it is expected that she will not survive past this date - which is estimated at 3 months (although could be less or a little more).   My recommendation would be to discuss the situation again with the patient and family. Unless there has been a change of heart with pacemaker replacement, the patient is DNR and I would consider not operating. Could consider external stabilization and pain control - consider palliative care consultation if necessary.  Cardiology will be available as needed.  Chrystie Nose, MD, [facility], FACP  [place]  [facility]  Medical Director of the Advanced Lipid Disorders &  Cardiovascular Risk Reduction Clinic Diplomate of the American Board of Clinical Lipidology Attending Cardiologist  Direct Dial: [phone]  Fax: [phone]  Website:  [email or web address]

## 2019-04-01 DIAGNOSIS — G309 Alzheimer's disease, unspecified: Secondary | ICD-10-CM

## 2019-04-01 DIAGNOSIS — I272 Pulmonary hypertension, unspecified: Secondary | ICD-10-CM

## 2019-04-01 DIAGNOSIS — F0280 Dementia in other diseases classified elsewhere without behavioral disturbance: Secondary | ICD-10-CM

## 2019-04-01 DIAGNOSIS — I5032 Chronic diastolic (congestive) heart failure: Secondary | ICD-10-CM

## 2019-04-01 DIAGNOSIS — I441 Atrioventricular block, second degree: Secondary | ICD-10-CM

## 2019-04-01 NOTE — Progress Notes (Signed)
PROGRESS NOTE    [name] [name]  FXT:[id number]  DOB: 1927/05/25  DOA: 03/30/2019 PCP: Kela Millin, MD   Brief Admission Hx: 85 year old female with complete heart block, indwelling pacemaker, advanced dementia, pulmonary hypertension, diabetes mellitus admitted from SNF after fall with right hip fracture.  MDM/Assessment & Plan:   1. Right hip fracture from fall- patient unable to have surgery due to not having a functional pacemaker and family had already decided not to replace the battery in the pacemaker back in February 2020.  I spoke with the patient's sons who serves as her healthcare power of attorney and the decision was made to pursue full comfort care and residential hospice placement.  The patient had very poor QOL prior to fall and her QOL likely would not improve with surgery for this hip fracture.  The family understands this and want to focus on comfort and dignity.  A referral has been placed for residential hospice.  The patient is being kept comfortable in the hospital.  Her life expectancy is likely less than 6 weeks given that her pacemaker battery is nearly exhausted in addition to high mortality associated with hip fracture.  Code Status: DNR Family Communication: None Disposition Plan: Residential hospice   Consultants:    Procedures:    Antimicrobials:     Subjective: Patient has advanced dementia however no specific complaints she appears comfortable and not able to focus to answer questions appropriately.  Objective: Vitals:   03/31/19 1548 03/31/19 2041 03/31/19 2119 04/01/19 0654  BP: (!) 110/50  (!) 146/69 (!) 156/65  Pulse: 84  (!) 57 (!) 56  Resp: 16  16 16   Temp: 98.6 F (37 C)  98.6 F (37 C) 99.2 F (37.3 C)  TempSrc: Oral  Oral Oral  SpO2: 100% 90% 97% 97%  Weight:        Intake/Output Summary (Last 24 hours) at 04/01/2019 2992 Last data filed at 03/31/2019 1400 Gross per 24 hour  Intake 200 ml  Output 800 ml  Net -600 ml    Filed Weights   03/30/19 2336  Weight: 79.5 kg     REVIEW OF SYSTEMS  As per history otherwise all reviewed and reported negative  Exam:  General exam: Elderly female lying in the bed in no apparent distress appears comfortable drifting in and out of sleepiness, with advanced dementia.  Rousable. Respiratory system: Clear. No increased work of breathing. Cardiovascular system: S1 & S2 heard.  Gastrointestinal system: Abdomen is nondistended, soft and nontender. Normal bowel sounds heard. Central nervous system: Advanced dementia Extremities: Right leg shortened and externally rotated.  Data Reviewed: Basic Metabolic Panel: Recent Labs  Lab 03/30/19 2115 03/31/19 0609  NA 139 141  K 3.7 3.8  CL 102 102  CO2 29 30  GLUCOSE 185* 150*  BUN 25* 20  CREATININE 1.06* 0.80  CALCIUM 8.5* 8.7*   Liver Function Tests: Recent Labs  Lab 03/31/19 1912  ALBUMIN 3.5   No results for input(s): LIPASE, AMYLASE in the last 168 hours. No results for input(s): AMMONIA in the last 168 hours. CBC: Recent Labs  Lab 03/30/19 2115 03/31/19 0609  WBC 8.3 8.1  NEUTROABS 5.3  --   HGB 11.1* 10.7*  HCT 36.1 34.5*  MCV 95.8 95.6  PLT 188 169   Cardiac Enzymes: No results for input(s): CKTOTAL, CKMB, CKMBINDEX, TROPONINI in the last 168 hours. CBG (last 3)  No results for input(s): GLUCAP in the last 72 hours. Recent Results (from the  past 240 hour(s))  Surgical PCR screen     Status: None   Collection Time: 03/31/19  1:08 AM  Result Value Ref Range Status   MRSA, PCR NEGATIVE NEGATIVE Final   Staphylococcus aureus NEGATIVE NEGATIVE Final    Comment: (NOTE) The Xpert SA Assay (FDA approved for NASAL specimens in patients 66 years of age and older), is one component of a comprehensive surveillance program. It is not intended to diagnose infection nor to guide or monitor treatment. Performed at [facility], [address], [place], [place] [address]      Studies: Dg Chest  1 View  Result Date: 03/30/2019 CLINICAL DATA:  Decreased O2 sats.  Pain after fall. EXAM: CHEST  1 VIEW COMPARISON:  February 02, 2017 FINDINGS: Stable cardiomegaly. Stable pacemaker. The hila, mediastinum, lungs, and pleura are unremarkable. IMPRESSION: No active disease. Electronically Signed   By: Gerome Sam III M.D   On: 03/30/2019 21:10   Ct Head Wo Contrast  Result Date: 03/30/2019 CLINICAL DATA:  85 year old female with head trauma. EXAM: CT HEAD WITHOUT CONTRAST CT CERVICAL SPINE WITHOUT CONTRAST TECHNIQUE: Multidetector CT imaging of the head and cervical spine was performed following the standard protocol without intravenous contrast. Multiplanar CT image reconstructions of the cervical spine were also generated. COMPARISON:  Head CT dated 02/02/2017 FINDINGS: CT HEAD FINDINGS Brain: There is moderate age-related atrophy and chronic microvascular ischemic changes. There is no acute intracranial hemorrhage. No mass effect or midline shift. No extra-axial fluid collection. Vascular: No hyperdense vessel or unexpected calcification. Skull: Normal. Negative for fracture or focal lesion. Sinuses/Orbits: The visualized paranasal sinuses and the right mastoid air cells are clear. Mild left mastoid effusions. Other: None CT CERVICAL SPINE FINDINGS Alignment: No acute subluxation.  Grade 1 C4-C5 anterolisthesis. Skull base and vertebrae: No acute fracture.  Osteopenia. Soft tissues and spinal canal: No prevertebral fluid or swelling. No visible canal hematoma. Disc levels: Multilevel degenerative changes with disc space narrowing and endplate irregularity. Multilevel facet arthropathy. Upper chest: Negative. Other: Bilateral thyroid nodules. Further evaluation with ultrasound on a nonemergent basis recommended. Bilateral carotid bulb calcified plaques. Partially visualized left pectoral pacemaker wires. IMPRESSION: 1. No acute intracranial hemorrhage. 2. No acute/traumatic cervical spine pathology.  Electronically Signed   By: Elgie Collard M.D.   On: 03/30/2019 21:20   Ct Cervical Spine Wo Contrast  Result Date: 03/30/2019 CLINICAL DATA:  85 year old female with head trauma. EXAM: CT HEAD WITHOUT CONTRAST CT CERVICAL SPINE WITHOUT CONTRAST TECHNIQUE: Multidetector CT imaging of the head and cervical spine was performed following the standard protocol without intravenous contrast. Multiplanar CT image reconstructions of the cervical spine were also generated. COMPARISON:  Head CT dated 02/02/2017 FINDINGS: CT HEAD FINDINGS Brain: There is moderate age-related atrophy and chronic microvascular ischemic changes. There is no acute intracranial hemorrhage. No mass effect or midline shift. No extra-axial fluid collection. Vascular: No hyperdense vessel or unexpected calcification. Skull: Normal. Negative for fracture or focal lesion. Sinuses/Orbits: The visualized paranasal sinuses and the right mastoid air cells are clear. Mild left mastoid effusions. Other: None CT CERVICAL SPINE FINDINGS Alignment: No acute subluxation.  Grade 1 C4-C5 anterolisthesis. Skull base and vertebrae: No acute fracture.  Osteopenia. Soft tissues and spinal canal: No prevertebral fluid or swelling. No visible canal hematoma. Disc levels: Multilevel degenerative changes with disc space narrowing and endplate irregularity. Multilevel facet arthropathy. Upper chest: Negative. Other: Bilateral thyroid nodules. Further evaluation with ultrasound on a nonemergent basis recommended. Bilateral carotid bulb calcified plaques. Partially visualized  left pectoral pacemaker wires. IMPRESSION: 1. No acute intracranial hemorrhage. 2. No acute/traumatic cervical spine pathology. Electronically Signed   By: Elgie Collard M.D.   On: 03/30/2019 21:20   Dg Hip Unilat With Pelvis 2-3 Views Right  Result Date: 03/30/2019 CLINICAL DATA:  Decreased O2 sats.  Right hip pain. EXAM: DG HIP (WITH OR WITHOUT PELVIS) 2-3V RIGHT COMPARISON:  None. FINDINGS:  There is a comminuted displaced fracture through the right intertrochanteric region without dislocation. IMPRESSION: There is a comminuted displaced fracture through the right intertrochanteric region. Electronically Signed   By: Gerome Sam III M.D   On: 03/30/2019 21:11   Scheduled Meds:  amLODipine  5 mg Oral Daily   carvedilol  12.5 mg Oral Daily   divalproex  125 mg Oral BID   donepezil  10 mg Oral QHS   escitalopram  10 mg Oral Daily   lisinopril  10 mg Oral Q0600   memantine  10 mg Oral BID   OXcarbazepine  150 mg Oral BID   QUEtiapine  50 mg Oral QHS   Continuous Infusions:  Active Problems:   Chronic diastolic heart failure (HCC)   PPM-St.Jude   Dyslipidemia   Mitral regurgitation   Pulmonary hypertension (HCC)   Second degree Mobitz II AV block   Dementia (HCC)   Closed right hip fracture, initial encounter (HCC)   Symptomatic advanced heart block  Time spent:   Standley Dakins, MD [facility] 04/01/2019, 9:09 AM    LOS: 2 days  How to contact the [facility] Attending or Consulting provider 7A - 7P or covering provider during after hours 7P -7A, for this patient?  1. Check the care team in [facility] and look for a) attending/consulting TRH provider listed and b) the [facility] team listed 2. Log into [email or web address] and use [place]'s universal password to access. If you do not have the password, please contact the hospital operator. 3. Locate the [facility] provider you are looking for under [facility] and page to a number that you can be directly reached. 4. If you still have difficulty reaching the provider, please page the [facility] (Director on Call) for the Hospitalists listed on amion for assistance.

## 2019-04-01 NOTE — Discharge Instructions (Signed)
Symptom management per hospice protocol  

## 2019-04-01 NOTE — Discharge Summary (Signed)
Physician Discharge Summary  [name] [name] QNV:[id number] DOB: 08-17-1927 DOA: 03/30/2019  PCP: Kela Millin, MD  Admit date: 03/30/2019 Discharge date: 04/01/2019  Disposition: RESIDENTIAL HOSPICE   Recommendations  SYMPTOM MANAGEMENT PER  HOSPICE PROTOCOL   Discharge Condition: HOSPICE   CODE STATUS: DNR    Brief Hospitalization Summary: Please see all hospital notes, images, labs for full details of the hospitalization. DR Daune Perch HPI: 85 y.o. female, with history of dementia, Hypertension, pulmonary hypertension, advanced third-degree heart block, status post pacemaker placement with family opting not to replace battery as per cardiology notes from February 2020, diabetes mellitus type 2, was brought to the ED after patient fell at the skilled facility.  Patient has dementia and is unable to provide any significant history.  She complains of pain in right lower extremity.  In the ED patient was found to have right intertrochanteric hip fracture.  Brief Admission Hx: 85 year old female with complete heart block, indwelling pacemaker, advanced dementia, pulmonary hypertension, diabetes mellitus admitted from SNF after fall with right hip fracture.  MDM/Assessment & Plan:   1. Right hip fracture from fall- patient unable to have surgery due to not having a functional pacemaker and family had already decided not to replace the battery in the pacemaker back in February 2020.  I spoke with the patient's sons who serves as her healthcare power of attorney and the decision was made to pursue full comfort care and residential hospice placement.  The patient had very poor QOL prior to fall and her QOL likely would not improve with surgery for this hip fracture.  The family understands this and want to focus on comfort and dignity.  A referral has been placed for residential hospice.  The patient was kept comfortable in the hospital.  Her life expectancy is likely less than 6 weeks given that her  pacemaker battery is nearly exhausted in addition to high mortality associated with hip fracture.  Code Status: DNR Family Communication: None Disposition Plan: Residential hospice  Consultants:  orthopedic surgery  Discharge Diagnoses:  Active Problems:   Chronic diastolic heart failure (HCC)   PPM-St.Jude   Dyslipidemia   Mitral regurgitation   Pulmonary hypertension (HCC)   Second degree Mobitz II AV block   Dementia (HCC)   Closed right hip fracture, initial encounter (HCC)   Symptomatic advanced heart block   Discharge Instructions:  Allergies as of 04/01/2019   No Known Allergies     Medication List    STOP taking these medications   acetaminophen 325 MG tablet Commonly known as:  TYLENOL   amLODipine 5 MG tablet Commonly known as:  NORVASC   carvedilol 12.5 MG tablet Commonly known as:  COREG   donepezil 10 MG tablet Commonly known as:  ARICEPT   escitalopram 10 MG tablet Commonly known as:  LEXAPRO   furosemide 40 MG tablet Commonly known as:  LASIX   HYDROcodone-acetaminophen 5-325 MG tablet Commonly known as:  NORCO/VICODIN   lisinopril 10 MG tablet Commonly known as:  PRINIVIL,ZESTRIL   memantine 10 MG tablet Commonly known as:  NAMENDA   metFORMIN 500 MG tablet Commonly known as:  GLUCOPHAGE   multivitamin tablet   OXYGEN   saccharomyces boulardii 250 MG capsule Commonly known as:  FLORASTOR   trimethoprim 100 MG tablet Commonly known as:  TRIMPEX     TAKE these medications   divalproex 125 MG DR tablet Commonly known as:  DEPAKOTE Take 125 mg by mouth 2 (two) times daily.  OXcarbazepine 150 MG tablet Commonly known as:  TRILEPTAL Take 150 mg by mouth 2 (two) times daily.   QUEtiapine 25 MG tablet Commonly known as:  SEROQUEL Take 12.5 mg by mouth at bedtime.       No Known Allergies Allergies as of 04/01/2019   No Known Allergies     Medication List    STOP taking these medications   acetaminophen 325 MG  tablet Commonly known as:  TYLENOL   amLODipine 5 MG tablet Commonly known as:  NORVASC   carvedilol 12.5 MG tablet Commonly known as:  COREG   donepezil 10 MG tablet Commonly known as:  ARICEPT   escitalopram 10 MG tablet Commonly known as:  LEXAPRO   furosemide 40 MG tablet Commonly known as:  LASIX   HYDROcodone-acetaminophen 5-325 MG tablet Commonly known as:  NORCO/VICODIN   lisinopril 10 MG tablet Commonly known as:  PRINIVIL,ZESTRIL   memantine 10 MG tablet Commonly known as:  NAMENDA   metFORMIN 500 MG tablet Commonly known as:  GLUCOPHAGE   multivitamin tablet   OXYGEN   saccharomyces boulardii 250 MG capsule Commonly known as:  FLORASTOR   trimethoprim 100 MG tablet Commonly known as:  TRIMPEX     TAKE these medications   divalproex 125 MG DR tablet Commonly known as:  DEPAKOTE Take 125 mg by mouth 2 (two) times daily.   OXcarbazepine 150 MG tablet Commonly known as:  TRILEPTAL Take 150 mg by mouth 2 (two) times daily.   QUEtiapine 25 MG tablet Commonly known as:  SEROQUEL Take 12.5 mg by mouth at bedtime.       Procedures/Studies: Dg Chest 1 View  Result Date: 03/30/2019 CLINICAL DATA:  Decreased O2 sats.  Pain after fall. EXAM: CHEST  1 VIEW COMPARISON:  February 02, 2017 FINDINGS: Stable cardiomegaly. Stable pacemaker. The hila, mediastinum, lungs, and pleura are unremarkable. IMPRESSION: No active disease. Electronically Signed   By: Gerome Sam III M.D   On: 03/30/2019 21:10   Ct Head Wo Contrast  Result Date: 03/30/2019 CLINICAL DATA:  85 year old female with head trauma. EXAM: CT HEAD WITHOUT CONTRAST CT CERVICAL SPINE WITHOUT CONTRAST TECHNIQUE: Multidetector CT imaging of the head and cervical spine was performed following the standard protocol without intravenous contrast. Multiplanar CT image reconstructions of the cervical spine were also generated. COMPARISON:  Head CT dated 02/02/2017 FINDINGS: CT HEAD FINDINGS Brain: There is  moderate age-related atrophy and chronic microvascular ischemic changes. There is no acute intracranial hemorrhage. No mass effect or midline shift. No extra-axial fluid collection. Vascular: No hyperdense vessel or unexpected calcification. Skull: Normal. Negative for fracture or focal lesion. Sinuses/Orbits: The visualized paranasal sinuses and the right mastoid air cells are clear. Mild left mastoid effusions. Other: None CT CERVICAL SPINE FINDINGS Alignment: No acute subluxation.  Grade 1 C4-C5 anterolisthesis. Skull base and vertebrae: No acute fracture.  Osteopenia. Soft tissues and spinal canal: No prevertebral fluid or swelling. No visible canal hematoma. Disc levels: Multilevel degenerative changes with disc space narrowing and endplate irregularity. Multilevel facet arthropathy. Upper chest: Negative. Other: Bilateral thyroid nodules. Further evaluation with ultrasound on a nonemergent basis recommended. Bilateral carotid bulb calcified plaques. Partially visualized left pectoral pacemaker wires. IMPRESSION: 1. No acute intracranial hemorrhage. 2. No acute/traumatic cervical spine pathology. Electronically Signed   By: Elgie Collard M.D.   On: 03/30/2019 21:20   Ct Cervical Spine Wo Contrast  Result Date: 03/30/2019 CLINICAL DATA:  85 year old female with head trauma. EXAM: CT HEAD WITHOUT CONTRAST CT  CERVICAL SPINE WITHOUT CONTRAST TECHNIQUE: Multidetector CT imaging of the head and cervical spine was performed following the standard protocol without intravenous contrast. Multiplanar CT image reconstructions of the cervical spine were also generated. COMPARISON:  Head CT dated 02/02/2017 FINDINGS: CT HEAD FINDINGS Brain: There is moderate age-related atrophy and chronic microvascular ischemic changes. There is no acute intracranial hemorrhage. No mass effect or midline shift. No extra-axial fluid collection. Vascular: No hyperdense vessel or unexpected calcification. Skull: Normal. Negative for  fracture or focal lesion. Sinuses/Orbits: The visualized paranasal sinuses and the right mastoid air cells are clear. Mild left mastoid effusions. Other: None CT CERVICAL SPINE FINDINGS Alignment: No acute subluxation.  Grade 1 C4-C5 anterolisthesis. Skull base and vertebrae: No acute fracture.  Osteopenia. Soft tissues and spinal canal: No prevertebral fluid or swelling. No visible canal hematoma. Disc levels: Multilevel degenerative changes with disc space narrowing and endplate irregularity. Multilevel facet arthropathy. Upper chest: Negative. Other: Bilateral thyroid nodules. Further evaluation with ultrasound on a nonemergent basis recommended. Bilateral carotid bulb calcified plaques. Partially visualized left pectoral pacemaker wires. IMPRESSION: 1. No acute intracranial hemorrhage. 2. No acute/traumatic cervical spine pathology. Electronically Signed   By: Elgie Collard M.D.   On: 03/30/2019 21:20   Dg Hip Unilat With Pelvis 2-3 Views Right  Result Date: 03/30/2019 CLINICAL DATA:  Decreased O2 sats.  Right hip pain. EXAM: DG HIP (WITH OR WITHOUT PELVIS) 2-3V RIGHT COMPARISON:  None. FINDINGS: There is a comminuted displaced fracture through the right intertrochanteric region without dislocation. IMPRESSION: There is a comminuted displaced fracture through the right intertrochanteric region. Electronically Signed   By: Gerome Sam III M.D   On: 03/30/2019 21:11      Subjective: Patient has advanced dementia however no specific complaints she appears comfortable and not able to focus to answer questions appropriately.  Discharge Exam: Vitals:   03/31/19 2119 04/01/19 0654  BP: (!) 146/69 (!) 156/65  Pulse: (!) 57 (!) 56  Resp: 16 16  Temp: 98.6 F (37 C) 99.2 F (37.3 C)  SpO2: 97% 97%   Vitals:   03/31/19 1548 03/31/19 2041 03/31/19 2119 04/01/19 0654  BP: (!) 110/50  (!) 146/69 (!) 156/65  Pulse: 84  (!) 57 (!) 56  Resp: 16  16 16   Temp: 98.6 F (37 C)  98.6 F (37 C) 99.2  F (37.3 C)  TempSrc: Oral  Oral Oral  SpO2: 100% 90% 97% 97%  Weight:        General exam: Elderly female lying in the bed in no apparent distress appears comfortable drifting in and out of sleepiness, with advanced dementia.  Easily arousable. Respiratory system: Clear. No increased work of breathing. Cardiovascular system: S1 & S2 heard.  Gastrointestinal system: Abdomen is nondistended, soft and nontender. Normal bowel sounds heard. Central nervous system: Advanced dementia Extremities: Right leg shortened and externally rotated.   The results of significant diagnostics from this hospitalization (including imaging, microbiology, ancillary and laboratory) are listed below for reference.    Microbiology: Recent Results (from the past 240 hour(s))  Surgical PCR screen     Status: None   Collection Time: 03/31/19  1:08 AM  Result Value Ref Range Status   MRSA, PCR NEGATIVE NEGATIVE Final   Staphylococcus aureus NEGATIVE NEGATIVE Final    Comment: (NOTE) The Xpert SA Assay (FDA approved for NASAL specimens in patients 80 years of age and older), is one component of a comprehensive surveillance program. It is not intended to diagnose infection nor to  guide or monitor treatment. Performed at [facility], [address] [place], [place] [address]     Labs: BNP (last 3 results) No results for input(s): BNP in the last 8760 hours. Basic Metabolic Panel: Recent Labs  Lab 03/30/19 2115 03/31/19 0609  NA 139 141  K 3.7 3.8  CL 102 102  CO2 29 30  GLUCOSE 185* 150*  BUN 25* 20  CREATININE 1.06* 0.80  CALCIUM 8.5* 8.7*   Liver Function Tests: Recent Labs  Lab 03/31/19 1912  ALBUMIN 3.5   No results for input(s): LIPASE, AMYLASE in the last 168 hours. No results for input(s): AMMONIA in the last 168 hours. CBC: Recent Labs  Lab 03/30/19 2115 03/31/19 0609  WBC 8.3 8.1  NEUTROABS 5.3  --   HGB 11.1* 10.7*  HCT 36.1 34.5*  MCV 95.8 95.6  PLT 188 169   Cardiac  Enzymes: No results for input(s): CKTOTAL, CKMB, CKMBINDEX, TROPONINI in the last 168 hours. BNP: Invalid input(s): POCBNP CBG: No results for input(s): GLUCAP in the last 168 hours. D-Dimer No results for input(s): DDIMER in the last 72 hours. Hgb A1c No results for input(s): HGBA1C in the last 72 hours. Lipid Profile No results for input(s): CHOL, HDL, LDLCALC, TRIG, CHOLHDL, LDLDIRECT in the last 72 hours. Thyroid function studies No results for input(s): TSH, T4TOTAL, T3FREE, THYROIDAB in the last 72 hours.  Invalid input(s): FREET3 Anemia work up No results for input(s): VITAMINB12, FOLATE, FERRITIN, TIBC, IRON, RETICCTPCT in the last 72 hours. Urinalysis    Component Value Date/Time   COLORURINE YELLOW 02/02/2017 0852   APPEARANCEUR CLEAR 02/02/2017 0852   LABSPEC 1.019 02/02/2017 0852   PHURINE 5.0 02/02/2017 0852   GLUCOSEU NEGATIVE 02/02/2017 0852   HGBUR NEGATIVE 02/02/2017 0852   BILIRUBINUR NEGATIVE 02/02/2017 0852   KETONESUR NEGATIVE 02/02/2017 0852   PROTEINUR 30 (A) 02/02/2017 0852   UROBILINOGEN 1.0 01/04/2009 1529   NITRITE NEGATIVE 02/02/2017 0852   LEUKOCYTESUR NEGATIVE 02/02/2017 0852   Sepsis Labs Invalid input(s): PROCALCITONIN,  WBC,  LACTICIDVEN Microbiology Recent Results (from the past 240 hour(s))  Surgical PCR screen     Status: None   Collection Time: 03/31/19  1:08 AM  Result Value Ref Range Status   MRSA, PCR NEGATIVE NEGATIVE Final   Staphylococcus aureus NEGATIVE NEGATIVE Final    Comment: (NOTE) The Xpert SA Assay (FDA approved for NASAL specimens in patients 44 years of age and older), is one component of a comprehensive surveillance program. It is not intended to diagnose infection nor to guide or monitor treatment. Performed at [facility], [address] [place], [place] [address]    Time coordinating discharge:   SIGNED:  Standley Dakins, MD  [facility] 04/01/2019, 12:05 PM How to contact the [facility] Attending or  Consulting provider 7A - 7P or covering provider during after hours 7P -7A, for this patient?  1. Check the care team in [facility] and look for a) attending/consulting TRH provider listed and b) the [facility] team listed 2. Log into [email or web address] and use [place]'s universal password to access. If you do not have the password, please contact the hospital operator. 3. Locate the [facility] provider you are looking for under [facility] and page to a number that you can be directly reached. 4. If you still have difficulty reaching the provider, please page the [facility] (Director on Call) for the Hospitalists listed on amion for assistance.

## 2019-04-01 NOTE — Progress Notes (Signed)
Patient ID: [name] [name], female   DOB: 1927/02/06, 85 y.o.   MRN: [id number] Patient has a inotrope fracture right hip she also has a pacemaker with a battery which needed replacement.  Family declined replacement of the battery.  After speaking with cardiology I felt that the risk of surgery was too high especially on the weekend with no cardiology present in the hospital.  Thought was that if the battery stopped during the procedure (patient totally pacemaker dependent) we could externally pace her.  That is beyond the scope of my expertise and even afte speaking with anesthesia they felt the same.   the family decided to put the patient in hospice  Signing off any further recommendations if needed from orthopedics please call us back

## 2019-04-01 NOTE — Progress Notes (Signed)
Nsg Discharge Note  Admit Date:  03/30/2019 Discharge date: 04/01/2019   [name] [name] to be D/C'd to [facility] per MD order.  AVS completed.  Copy for chart, and copy for patient signed, and dated. Patient/caregiver able to verbalize understanding.  Discharge Medication: Allergies as of 04/01/2019   No Known Allergies     Medication List    STOP taking these medications   acetaminophen 325 MG tablet Commonly known as:  TYLENOL   amLODipine 5 MG tablet Commonly known as:  NORVASC   carvedilol 12.5 MG tablet Commonly known as:  COREG   donepezil 10 MG tablet Commonly known as:  ARICEPT   escitalopram 10 MG tablet Commonly known as:  LEXAPRO   furosemide 40 MG tablet Commonly known as:  LASIX   HYDROcodone-acetaminophen 5-325 MG tablet Commonly known as:  NORCO/VICODIN   lisinopril 10 MG tablet Commonly known as:  PRINIVIL,ZESTRIL   memantine 10 MG tablet Commonly known as:  NAMENDA   metFORMIN 500 MG tablet Commonly known as:  GLUCOPHAGE   multivitamin tablet   OXYGEN   saccharomyces boulardii 250 MG capsule Commonly known as:  FLORASTOR   trimethoprim 100 MG tablet Commonly known as:  TRIMPEX     TAKE these medications   divalproex 125 MG DR tablet Commonly known as:  DEPAKOTE Take 125 mg by mouth 2 (two) times daily.   OXcarbazepine 150 MG tablet Commonly known as:  TRILEPTAL Take 150 mg by mouth 2 (two) times daily.   QUEtiapine 25 MG tablet Commonly known as:  SEROQUEL Take 12.5 mg by mouth at bedtime.       Discharge Assessment: Vitals:   03/31/19 2119 04/01/19 0654  BP: (!) 146/69 (!) 156/65  Pulse: (!) 57 (!) 56  Resp: 16 16  Temp: 98.6 F (37 C) 99.2 F (37.3 C)  SpO2: 97% 97%   Skin clean, dry and intact without evidence of skin break down, no evidence of skin tears noted. IV catheter discontinued intact. Site without signs and symptoms of complications - no redness or edema noted at insertion site, patient denies c/o pain -  only slight tenderness at site.  Dressing with slight pressure applied.  D/c Instructions-Education: Discharge instructions put in discharge package and report given to Robin at [facility] with verbalized understanding. D/c education completed with Hospice Home nurse including follow up instructions, medication list, d/c activities limitations if indicated, with other d/c instructions as indicated by MD - Zella Ball from Hospice Home able to verbalize understanding, all questions fully answered. Patient transferred to [facility] via EMS.  Andria Rhein, RN 04/01/2019 12:19 PM

## 2019-04-01 NOTE — TOC Transition Note (Signed)
Transition of Care [facility]) - CM/SW Discharge Note   Patient Details  Name: [name] [name] MRN: [id number] Date of Birth: Apr 17, 1927  Transition of Care [facility]) CM/SW Contact:  Judi Cong, LCSW Phone Number: 04/01/2019, 11:56 AM   Clinical Narrative:   The CSW received confirmation from Robin at [facility] that the patient can transfer to the hospice home facility today. The CSW has updated the attending MD to prepare the discharge summary. The CSW contacted the patient's family and advised that the patient has been accepted and that a family member, preferably the HCPOA, would need to complete admissions documentation at the facility. The patient's daughter-in-law thanked the CSW for assistance. The CSW updated the attending RN of the report number ([id number]) and that the medical necessity form for EMS would be printed to the department printer. Once the discharge summary is available, the CSW will fax it to the facility. After this transmission, the TOC team will sign off. Please consult should needs arise.    Final next level of care: Hospice Medical Facility Barriers to Discharge: Continued Medical Work up, Other (comment)(Pending Hospice Referral)   Patient Goals and CMS Choice Patient states their goals for this hospitalization and ongoing recovery are:: N/A CMS Medicare.gov Compare Post Acute Care list provided to:: Other (Comment Required)(N/A) Choice offered to / list presented to : NA  Discharge Placement    [facility]          Discharge Plan and Services In-house Referral: NA Discharge Planning Services: NA Post Acute Care Choice: NA          DME Arranged: N/A DME Agency: NA HH Arranged: NA     Social Determinants of Health (SDOH) Interventions     Readmission Risk Interventions No flowsheet data found.

## 2019-04-01 NOTE — TOC Progression Note (Addendum)
Transition of Care [facility]) - Progression Note    Patient Details  Name: BEVIN ANDINO MRN: [id number] Date of Birth: [birth year]  Transition of Care [facility]) CM/SW Contact  Judi Cong, [place] Phone Number: 04/01/2019, 9:45 AM  Clinical Narrative:   CSW attempted to contact [facility] to inquire about possible transfer for this patient. No one answered and no ability to leave a VM. CSW will continue to attempt. TOC team is following.  UPDATE: 1145: Lynn with [facility] requested the patient's albumin level which the CSW provided verbally. Larita Fife will contact the CSW once the medical director has reviewed the referral. CSW has updated the attending MD via secure chat. TOC team is following.    Expected Discharge Plan: Hospice Medical Facility Barriers to Discharge: Continued Medical Work up, Other (comment)(Pending Hospice Referral)  Expected Discharge Plan and Services Expected Discharge Plan: Hospice Medical Facility In-house Referral: NA Discharge Planning Services: NA Post Acute Care Choice: NA Living arrangements for the past 2 months: Skilled Nursing Facility                 DME Arranged: N/A DME Agency: NA HH Arranged: NA     Social Determinants of Health (SDOH) Interventions    Readmission Risk Interventions No flowsheet data found.

## 2019-04-03 LAB — TYPE AND SCREEN
ABO/RH(D): A NEG
Antibody Screen: POSITIVE
Unit division: 0

## 2019-04-27 NOTE — Telephone Encounter (Signed)
Pt with recent hip fx.  Pt discharging to hospice care with less than 6 weeks to live.  All parties aware to not replace pacemaker battery.  No further action necessary.

## 2019-04-27 DEATH — deceased

## 2019-05-07 ENCOUNTER — Encounter: Payer: Medicare Other | Admitting: Internal Medicine
